# Patient Record
Sex: Female | Born: 1960 | Race: Black or African American | Hispanic: No | Marital: Married | Smoking: Never smoker
Health system: Southern US, Community
[De-identification: ages and names within clinical notes are randomized; demographics above are authoritative.]

## PROBLEM LIST (undated history)

## (undated) DIAGNOSIS — J45909 Unspecified asthma, uncomplicated: Secondary | ICD-10-CM

## (undated) DIAGNOSIS — R011 Cardiac murmur, unspecified: Secondary | ICD-10-CM

## (undated) DIAGNOSIS — E785 Hyperlipidemia, unspecified: Secondary | ICD-10-CM

## (undated) DIAGNOSIS — M1990 Unspecified osteoarthritis, unspecified site: Secondary | ICD-10-CM

## (undated) DIAGNOSIS — T7840XA Allergy, unspecified, initial encounter: Secondary | ICD-10-CM

## (undated) DIAGNOSIS — I10 Essential (primary) hypertension: Secondary | ICD-10-CM

## (undated) DIAGNOSIS — B019 Varicella without complication: Secondary | ICD-10-CM

## (undated) HISTORY — DX: Varicella without complication: B01.9

## (undated) HISTORY — DX: Allergy, unspecified, initial encounter: T78.40XA

## (undated) HISTORY — DX: Unspecified asthma, uncomplicated: J45.909

## (undated) HISTORY — DX: Essential (primary) hypertension: I10

## (undated) HISTORY — DX: Unspecified osteoarthritis, unspecified site: M19.90

## (undated) HISTORY — DX: Cardiac murmur, unspecified: R01.1

## (undated) HISTORY — DX: Hyperlipidemia, unspecified: E78.5

---

## 1993-01-29 HISTORY — PX: ABDOMINAL HYSTERECTOMY: SHX81

## 1997-12-16 ENCOUNTER — Other Ambulatory Visit: Admission: RE | Admit: 1997-12-16 | Discharge: 1997-12-16 | Payer: Self-pay | Admitting: Obstetrics & Gynecology

## 1999-01-11 ENCOUNTER — Other Ambulatory Visit: Admission: RE | Admit: 1999-01-11 | Discharge: 1999-01-11 | Payer: Self-pay | Admitting: Obstetrics & Gynecology

## 1999-09-20 ENCOUNTER — Encounter: Admission: RE | Admit: 1999-09-20 | Discharge: 1999-09-20 | Payer: Self-pay | Admitting: Obstetrics & Gynecology

## 1999-09-20 ENCOUNTER — Encounter: Payer: Self-pay | Admitting: Obstetrics & Gynecology

## 2000-03-29 ENCOUNTER — Other Ambulatory Visit: Admission: RE | Admit: 2000-03-29 | Discharge: 2000-03-29 | Payer: Self-pay | Admitting: Obstetrics & Gynecology

## 2001-09-04 ENCOUNTER — Encounter: Payer: Self-pay | Admitting: Obstetrics & Gynecology

## 2001-09-04 ENCOUNTER — Encounter: Admission: RE | Admit: 2001-09-04 | Discharge: 2001-09-04 | Payer: Self-pay | Admitting: Obstetrics & Gynecology

## 2002-04-27 ENCOUNTER — Other Ambulatory Visit: Admission: RE | Admit: 2002-04-27 | Discharge: 2002-04-27 | Payer: Self-pay | Admitting: Obstetrics & Gynecology

## 2002-05-21 ENCOUNTER — Ambulatory Visit (HOSPITAL_COMMUNITY): Admission: RE | Admit: 2002-05-21 | Discharge: 2002-05-21 | Payer: Self-pay | Admitting: Obstetrics & Gynecology

## 2002-05-21 ENCOUNTER — Encounter (INDEPENDENT_AMBULATORY_CARE_PROVIDER_SITE_OTHER): Payer: Self-pay | Admitting: Specialist

## 2004-09-15 ENCOUNTER — Encounter: Admission: RE | Admit: 2004-09-15 | Discharge: 2004-09-15 | Payer: Self-pay | Admitting: Obstetrics & Gynecology

## 2005-09-26 ENCOUNTER — Encounter: Admission: RE | Admit: 2005-09-26 | Discharge: 2005-09-26 | Payer: Self-pay | Admitting: Obstetrics & Gynecology

## 2006-05-13 ENCOUNTER — Encounter: Admission: RE | Admit: 2006-05-13 | Discharge: 2006-05-13 | Payer: Self-pay | Admitting: Obstetrics & Gynecology

## 2007-06-04 ENCOUNTER — Encounter: Admission: RE | Admit: 2007-06-04 | Discharge: 2007-06-04 | Payer: Self-pay | Admitting: Obstetrics & Gynecology

## 2007-06-12 ENCOUNTER — Encounter: Admission: RE | Admit: 2007-06-12 | Discharge: 2007-06-12 | Payer: Self-pay | Admitting: Obstetrics & Gynecology

## 2008-06-07 ENCOUNTER — Encounter: Admission: RE | Admit: 2008-06-07 | Discharge: 2008-06-07 | Payer: Self-pay | Admitting: Obstetrics & Gynecology

## 2009-07-13 ENCOUNTER — Encounter: Admission: RE | Admit: 2009-07-13 | Discharge: 2009-07-13 | Payer: Self-pay | Admitting: Obstetrics & Gynecology

## 2009-07-19 ENCOUNTER — Encounter: Admission: RE | Admit: 2009-07-19 | Discharge: 2009-07-19 | Payer: Self-pay | Admitting: Obstetrics & Gynecology

## 2010-06-16 NOTE — Op Note (Signed)
NAME:  Kirsten Campbell, Kirsten Campbell                         ACCOUNT NO.:  [id number]   MEDICAL RECORD NO.:  [id number]                   PATIENT TYPE:  AMB   LOCATION:  SDC                                  FACILITY:  [facility]   PHYSICIAN:  Freddy Finner, M.D.                DATE OF BIRTH:  [birth year]   DATE OF PROCEDURE:  05/21/2002  DATE OF DISCHARGE:                                 OPERATIVE REPORT   PREOPERATIVE DIAGNOSES:  1. Right ovarian cyst.  2. Chronic right pelvic pain   POSTOPERATIVE DIAGNOSES:  1. Extensive intraabdominal and pelvic adhesions.  2. Benign-appearing simple right ovarian cyst.   OPERATIVE PROCEDURE:  Laparoscopy, lysis of adhesions, aspiration of cyst  fluid, peritoneal washings.   ANESTHESIA:  General.   INTRAOPERATIVE COMPLICATIONS:  None.   SURGEON:  Freddy Finner, M.D.   ESTIMATED INTRAOPERATIVE BLOOD LOSS:  10 mL.   INDICATIONS FOR PROCEDURE:  The patient is a 50 year old with chronic right  pelvic pain who was found to have an ovarian cyst which was simple in  appearance that persisted for a period of four to six weeks.  She has a long  history of chronic right pelvic pain and dyspareunia.  She is admitted now  for laparoscopy.   DESCRIPTION OF PROCEDURE:  She was admitted on the morning of surgery,  brought to the operating room, there placed under adequate general  endotracheal anesthesia, placed in the dorsal lithotomy position using the  [facility] stirrup system.  Betadine prep of abdomen was carried out in the usual  fashion.  The bladder was evacuated using sterile technique and a Robinson  catheter.  Sterile drapes were applied.  Two incisions were initially made -  one to the umbilicus and one in the midline approximately 3 cm above the  symphysis.  Because of the panniculus and the obesity it was difficult  placing the 11 mm disposable nonbladed trocar at the umbilicus, but  eventually the peritoneum was entered.  There was no evidence of injury  on  entry.  Pneumoperitoneum was allowed to accumulate with carbon dioxide gas.  A second trocar was placed through the incision initially made but this was  high enough that it conflicted with the umbilical trocar and for that reason  a third incision was made immediately above the symphysis in the midline.  The 5 mm trocar was then adequately replaced through this incision under  direct visualization.  There were extensive adhesions in the pelvis.  The  left tube and ovary were noted to be surgically absent and the adhesions in  this location were not addressed.  There were near-midline adhesions with  omental and/or epiploica bowel stuck to the anterior abdominal wall.  Using  the Endoshears and unipolar cautery many of these adhesions were lysed.  The  rectosigmoid colon was distorted and was in traction to the right side.  These adhesions too  were lysed to partially release this traction on the  rectosigmoid.  A bleeding source was encountered but this was adequately  controlled using bipolar coagulation.  This was confirmed by inspection  under reduced pressure and inspection under irrigation.  The ovary had a  very small cyst.  Aspiration produced a very small amount of fluid.  This  was submitted separately.  Lactated Ringers solution was used for irrigation  and the initial irrigant was aspirated and submitted for peritoneal  washings.  The appendix was visualized and was normal.  The right ovary was  essentially normal.  Photos were made of all of these findings.  There was  no active evidence of pelvic endometriosis.  The remaining fallopian tube  was normal.  It was elected not to remove the ovary because of the patient's  age and continued hormonal function from the ovary.  Irrigation was carried  out; hemostasis was confirmed.  The procedure at this point was terminated.  The irrigating solution was aspirated from the abdomen.  All instruments  were removed.  The skin  incisions were closed with interrupted subcuticular  sutures of 3-0 Dexon.  The two lower incisions were also closed with Steri-  Strips.  The patient was awakened and taken to recovery in good condition.   DISPOSITION:  She will be discharged in the immediate postoperative period  for follow-up in the office in two weeks.  She was given routine outpatient  surgical instructions.  She was given Vicodin to be taken as needed for  pain.                                               Freddy Finner, M.D.    WRN/MEDQ  D:  05/21/2002  T:  05/21/2002  Job:  [id number]

## 2010-08-01 ENCOUNTER — Other Ambulatory Visit: Payer: Self-pay | Admitting: Obstetrics & Gynecology

## 2010-08-01 DIAGNOSIS — Z1231 Encounter for screening mammogram for malignant neoplasm of breast: Secondary | ICD-10-CM

## 2010-08-08 ENCOUNTER — Ambulatory Visit: Payer: Self-pay

## 2010-08-09 ENCOUNTER — Ambulatory Visit
Admission: RE | Admit: 2010-08-09 | Discharge: 2010-08-09 | Disposition: A | Discharge status: Home / Self Care | Payer: BC Managed Care – PPO | Source: Ambulatory Visit | Attending: Obstetrics & Gynecology | Admitting: Obstetrics & Gynecology

## 2010-08-09 DIAGNOSIS — Z1231 Encounter for screening mammogram for malignant neoplasm of breast: Secondary | ICD-10-CM

## 2011-07-12 ENCOUNTER — Other Ambulatory Visit: Payer: Self-pay | Admitting: Obstetrics & Gynecology

## 2011-07-12 DIAGNOSIS — Z1231 Encounter for screening mammogram for malignant neoplasm of breast: Secondary | ICD-10-CM

## 2011-08-13 ENCOUNTER — Ambulatory Visit
Admission: RE | Admit: 2011-08-13 | Discharge: 2011-08-13 | Disposition: A | Discharge status: Home / Self Care | Payer: BC Managed Care – PPO | Source: Ambulatory Visit | Attending: Obstetrics & Gynecology | Admitting: Obstetrics & Gynecology

## 2011-08-13 DIAGNOSIS — Z1231 Encounter for screening mammogram for malignant neoplasm of breast: Secondary | ICD-10-CM

## 2012-07-16 ENCOUNTER — Other Ambulatory Visit: Payer: Self-pay

## 2012-07-16 DIAGNOSIS — Z1231 Encounter for screening mammogram for malignant neoplasm of breast: Secondary | ICD-10-CM

## 2012-08-13 ENCOUNTER — Ambulatory Visit: Payer: BC Managed Care – PPO

## 2012-09-01 ENCOUNTER — Ambulatory Visit
Admission: RE | Admit: 2012-09-01 | Discharge: 2012-09-01 | Disposition: A | Discharge status: Home / Self Care | Payer: BC Managed Care – PPO | Source: Ambulatory Visit

## 2012-09-01 DIAGNOSIS — Z1231 Encounter for screening mammogram for malignant neoplasm of breast: Secondary | ICD-10-CM

## 2012-09-02 ENCOUNTER — Other Ambulatory Visit: Payer: Self-pay | Admitting: Obstetrics & Gynecology

## 2012-09-02 DIAGNOSIS — R928 Other abnormal and inconclusive findings on diagnostic imaging of breast: Secondary | ICD-10-CM

## 2012-09-18 ENCOUNTER — Ambulatory Visit
Admission: RE | Admit: 2012-09-18 | Discharge: 2012-09-18 | Disposition: A | Discharge status: Home / Self Care | Payer: BC Managed Care – PPO | Source: Ambulatory Visit | Attending: Obstetrics & Gynecology | Admitting: Obstetrics & Gynecology

## 2012-09-18 DIAGNOSIS — R928 Other abnormal and inconclusive findings on diagnostic imaging of breast: Secondary | ICD-10-CM

## 2012-09-25 ENCOUNTER — Other Ambulatory Visit: Payer: Self-pay | Admitting: Obstetrics & Gynecology

## 2012-09-25 DIAGNOSIS — R922 Inconclusive mammogram: Secondary | ICD-10-CM

## 2013-05-07 ENCOUNTER — Other Ambulatory Visit (INDEPENDENT_AMBULATORY_CARE_PROVIDER_SITE_OTHER): Payer: BC Managed Care – PPO

## 2013-05-07 ENCOUNTER — Encounter: Payer: Self-pay | Admitting: Family Medicine

## 2013-05-07 ENCOUNTER — Ambulatory Visit (INDEPENDENT_AMBULATORY_CARE_PROVIDER_SITE_OTHER): Payer: BC Managed Care – PPO | Admitting: Family Medicine

## 2013-05-07 VITALS — BP 122/84 | HR 71 | Wt 218.0 lb

## 2013-05-07 DIAGNOSIS — M1710 Unilateral primary osteoarthritis, unspecified knee: Secondary | ICD-10-CM | POA: Insufficient documentation

## 2013-05-07 DIAGNOSIS — M25569 Pain in unspecified knee: Secondary | ICD-10-CM

## 2013-05-07 DIAGNOSIS — M25561 Pain in right knee: Secondary | ICD-10-CM

## 2013-05-07 DIAGNOSIS — M79609 Pain in unspecified limb: Secondary | ICD-10-CM

## 2013-05-07 DIAGNOSIS — M79671 Pain in right foot: Secondary | ICD-10-CM

## 2013-05-07 DIAGNOSIS — M7742 Metatarsalgia, left foot: Secondary | ICD-10-CM

## 2013-05-07 DIAGNOSIS — M7741 Metatarsalgia, right foot: Secondary | ICD-10-CM

## 2013-05-07 DIAGNOSIS — M7750 Other enthesopathy of unspecified foot: Secondary | ICD-10-CM

## 2013-05-07 NOTE — Assessment & Plan Note (Signed)
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 I do think patient may do better with losing weight. Patient was given a brace today that was fitted by me. We discussed icing protocol and over-the-counter medications he can be beneficial. Patient will try these interventions and come back again in 3-4 weeks. I do think patient's foot could also be contributing and we'll see if we make adjustments there if this will improve.

## 2013-05-07 NOTE — Patient Instructions (Signed)
Always good to see you Wear brace daily for next 2 weeks Ice 20 minutes 2 times daily exercises 3 times a week.  Take tylenol (acetaminophen) 650 mg three times a day is the best evidence based medicine we have for arthritis.  Glucosamine sulfate 750mg  twice a day is a supplement that has been shown to help moderate to severe arthritis. Vitamin D 2000 IU daily Fish oil 2 grams daily.  Tumeric 500mg  twice daily.  Capsaicin topically up to four times a day may also help with pain. Cortisone injections are an option if these interventions do not seem to make a difference or need more relief.  If cortisone injections do not help, there are different types of shots that may help but they take longer to take effect.  We can discuss this at follow up.  It's important that you continue to stay active. Controlling your weight is important.  Consider physical therapy to strengthen muscles around the joint that hurts to take pressure off of the joint itself. Shoe inserts with good arch support may be helpful.  [employer] could help.  Water aerobics and cycling with low resistance are the best two types of exercise for arthritis. Come back and see me in 3 weeks.

## 2013-05-07 NOTE — Progress Notes (Signed)
[name] [address] [place], [place] [address] Phone: [phone] Subjective:     CC:  Pain in knee and foot.   QMV:[id number] Kirsten Campbell is a 53 y.o. female coming in with complaint of right knee and the pain  Regarding patient's right knee pain she has had this for years. Patient has seen another provider and has had steroid injections as well as viscous supplementation multiple years ago. Patient has been told that she does have severe arthritis in this knee and they did consider a knee replacement. Patient like to avoid this if possible. Patient describes the pain as more of a dull aching sensation that is worse with a lot of twisting motions. Patient states that she can walk usually without any significant trouble going up or down stairs can be a triple. Denies any radiation down the leg or any numbness in the foot and does not notice any significant weakness. Patient states that the pain can be so bad but the severity of 7 at 10 that can contribute.  Patient is also complaining of right foot pain. Patient states hurts on the top of her foot mostly in the arch. Patient states throughout the week she does fairly well but then she wears heels on Sunday and then has pain for 4 days afterwards. Patient states that she needs to continue to wear her heels for church she notes that this. Patient notices improvement in pain when and when not wearing certain shoes. Patient denies any numbness. Has never had any true injury to the foot. States that from time to time she cannot swelling. Pain is 6/10.     Past medical history, social, surgical and family history all reviewed in electronic medical record.   Review of Systems: No headache, visual changes, nausea, vomiting, diarrhea, constipation, dizziness, abdominal pain, skin rash, fevers, chills, night sweats, weight loss, swollen lymph nodes, body aches, joint swelling, muscle aches, chest pain, shortness of  breath, mood changes.   Objective Blood pressure 122/84, pulse 71, weight 218 lb (98.884 kg), SpO2 99.00%.  General: No apparent distress alert and oriented x3 mood and affect normal, dressed appropriately.  HEENT: Pupils equal, extraocular movements intact  Respiratory: Patient's speak in full sentences and does not appear short of breath  Cardiovascular: No lower extremity edema, non tender, no erythema  Skin: Warm dry intact with no signs of infection or rash on extremities or on axial skeleton.  Abdomen: Soft nontender  Neuro: Cranial nerves II through XII are intact, neurovascularly intact in all extremities with 2+ DTRs and 2+ pulses.  Lymph: No lymphadenopathy of posterior or anterior cervical chain or axillae bilaterally.  Gait normal with good balance and coordination.  MSK:  Non tender with full range of motion and good stability and symmetric strength and tone of shoulders, elbows, wrist, hip, and ankles bilaterally.  Knee: Right Patient does have mild osteoarthritic changes of the knee on inspection Patient is minimally tender to palpation over the medial joint line otherwise unremarkable ROM full in flexion and extension and lower leg rotation. Ligaments with solid consistent endpoints including ACL, PCL, LCL, MCL. Positive Mcmurray's, Apley's, and Thessalonian tests. Non painful patellar compression. Patellar glide without crepitus. Patellar and quadriceps tendons unremarkable. Hamstring and quadriceps strength is normal.  Foot exam shows the patient does have significant bunion and bunionette formations of the feet bilaterally with patient having fibular deviation of the large toe significantly bilaterally. Patient does have a breakdown of the  transverse as well as longitudinal arches with mild varus deformity of the hindfoot.  MSK US performed of: Right knee This study was ordered, performed, and interpreted by Terrilee FilesZach Shalene Gallen D.O.  Knee: All structures  visualized. Anteromedial, anterolateral, posteromedial, and posterolateral menisci unremarkable without tearing, fraying, effusion, or displacement. And does have degenerative changes but no specific tear appreciated. Patient does have narrowing of the joint space bilaterally Patellar Tendon unremarkable on long and transverse views without effusion. No abnormality of prepatellar bursa. LCL and MCL unremarkable on long and transverse views. No abnormality of origin of medial or lateral head of the gastrocnemius.  IMPRESSION:  Degenerative changes of the meniscus and underlying osteoarthritic changes.    Impression and Recommendations:     This case required medical decision making of moderate complexity.

## 2013-05-07 NOTE — Assessment & Plan Note (Signed)
Patient does have significant breakdown of her feet bilaterally but likely has midfoot osteoarthritic changes. We discussed over-the-counter medications he can be beneficial as well as an icing regimen. Patient given home exercise program to try to help with keeping the amount of arch she has left. We discussed over-the-counter orthotics as well as shoe choices. Patient had these interventions and come back again in 3-4 weeks. She continues to have pain we will do custom orthotics.

## 2013-05-28 ENCOUNTER — Ambulatory Visit (INDEPENDENT_AMBULATORY_CARE_PROVIDER_SITE_OTHER): Payer: BC Managed Care – PPO | Admitting: Family Medicine

## 2013-05-28 ENCOUNTER — Encounter: Payer: Self-pay | Admitting: Family Medicine

## 2013-05-28 VITALS — BP 138/86 | HR 78

## 2013-05-28 DIAGNOSIS — M7741 Metatarsalgia, right foot: Secondary | ICD-10-CM

## 2013-05-28 DIAGNOSIS — M1710 Unilateral primary osteoarthritis, unspecified knee: Secondary | ICD-10-CM

## 2013-05-28 DIAGNOSIS — M7742 Metatarsalgia, left foot: Secondary | ICD-10-CM

## 2013-05-28 DIAGNOSIS — M7750 Other enthesopathy of unspecified foot: Secondary | ICD-10-CM

## 2013-05-28 NOTE — Patient Instructions (Signed)
Good to see you as always.  Continue the exercises.  Continue the meds for now Try not to wear the knee brace for short trips out of the house.  Continue the exercises at least 4 times a week.  I will drop off orthotics on Monday wear first day for 4 hours then increase 1 hour a day.  See me again in 4 weeks.

## 2013-05-28 NOTE — Assessment & Plan Note (Signed)
Patient was made custom orthotics today. Patient will slowly progressing wearing them over the course of time. I think this will help a lot of her significant for pain. Patient will continue the exercises on the over-the-counter medications and ice when necessary. Patient continues have pain with no is try admitted foot intra-articular injection. Patient come back again in 3-4 weeks for further evaluation and treatment.

## 2013-05-28 NOTE — Progress Notes (Signed)
[facility] Jonathyn Carothers D.O. [facility] [address] [place], [place] [address] Phone: [phone]) [phone] Subjective:     CC:  Pain in knee and foot follow  up  BJY:NWGNFAOZHYHPI:Subjective Kirsten Campbell is a 53 y.o. female coming in with complaint of right knee and the pain  Patient is returning for followup of her right knee pain. Patient has degenerative changes of the meniscus as well as osteoarthritic findings. Patient has been doing home exercise program, icing protocol we discussed over-the-counter medications. Patient states she is doing significantly better with the knee. Patient states that the brace does help. Patient states he medications do seem to help as well. Overall she continues to do icing as well and is not giving her any instability but she was having before. It is fairly happy with the results of her knee.  Patient also has significant amount of arch pain in the bilateral feet. Patient did get new shoes as well as started taking over-the-counter medications which has helped. Patient is still has significant amount of arch pain mostly on the right foot especially at the end of a long day. Patient has not been wearing her heels as much which has helped a little bit. Patient though finds it difficult to get through a day and has been doing all conservative therapy without significant improvement though.     Past medical history, social, surgical and family history all reviewed in electronic medical record.   Review of Systems: No headache, visual changes, nausea, vomiting, diarrhea, constipation, dizziness, abdominal pain, skin rash, fevers, chills, night sweats, weight loss, swollen lymph nodes, body aches, joint swelling, muscle aches, chest pain, shortness of breath, mood changes.   Objective Blood pressure 138/86, pulse 78, SpO2 96.00%.  General: No apparent distress alert and oriented x3 mood and affect normal, dressed appropriately.  HEENT: Pupils equal, extraocular movements intact    Respiratory: Patient's speak in full sentences and does not appear short of breath  Cardiovascular: No lower extremity edema, non tender, no erythema  Skin: Warm dry intact with no signs of infection or rash on extremities or on axial skeleton.  Abdomen: Soft nontender  Neuro: Cranial nerves II through XII are intact, neurovascularly intact in all extremities with 2+ DTRs and 2+ pulses.  Lymph: No lymphadenopathy of posterior or anterior cervical chain or axillae bilaterally.  Gait normal with good balance and coordination.  MSK:  Non tender with full range of motion and good stability and symmetric strength and tone of shoulders, elbows, wrist, hip, and ankles bilaterally.  Knee: Right Patient does have mild osteoarthritic changes of the knee on inspection Patient is minimally tender to palpation over the medial joint line otherwise unremarkable mild improvement from previous exam. ROM full in flexion and extension and lower leg rotation. Ligaments with solid consistent endpoints including ACL, PCL, LCL, MCL. Negative Mcmurray's, Apley's, and Thessalonian tests. Non painful patellar compression. Patellar glide without crepitus. Patellar and quadriceps tendons unremarkable. Hamstring and quadriceps strength is normal.  Foot exam shows the patient does have significant bunion and bunionette formations of the feet bilaterally with patient having fibular deviation of the large toe significantly bilaterally. Patient does have a breakdown of the transverse as well as longitudinal arches with mild varus deformity of the hindfoot.   Patient was fitted for a : standard, cushioned, semi-rigid orthotic. The orthotic was heated and afterward the patient stood on the orthotic blank positioned on the orthotic stand. The patient was positioned in subtalar neutral position and 10  degrees of ankle dorsiflexion in a weight bearing stance. After completion of molding, a stable base was applied to the  orthotic blank. The blank was ground to a stable position for weight bearing. Size: 10 Base: [facility] Additional Posting and Padding: None The patient ambulated these, and they were very comfortable.  I spent 45 minutes with this patient, greater than 50% was face-to-face time counseling regarding the below diagnosis.    Impression and Recommendations:

## 2013-05-28 NOTE — Assessment & Plan Note (Signed)
Patient is doing better with knee brace and home activity. Patient encouraged to continue this at least 3-4 times a week as well as the icing protocol. Patient will continue over-the-counter medications. If patient has any exacerbation we'll consider corticosteroid injection. The patient is doing well and I do not think this is necessary at this time the

## 2013-08-14 ENCOUNTER — Other Ambulatory Visit: Payer: Self-pay

## 2013-08-14 DIAGNOSIS — Z1231 Encounter for screening mammogram for malignant neoplasm of breast: Secondary | ICD-10-CM

## 2013-08-14 DIAGNOSIS — Z803 Family history of malignant neoplasm of breast: Secondary | ICD-10-CM

## 2013-09-07 ENCOUNTER — Encounter (INDEPENDENT_AMBULATORY_CARE_PROVIDER_SITE_OTHER): Payer: Self-pay

## 2013-09-07 ENCOUNTER — Ambulatory Visit
Admission: RE | Admit: 2013-09-07 | Discharge: 2013-09-07 | Disposition: A | Discharge status: Home / Self Care | Payer: BC Managed Care – PPO | Source: Ambulatory Visit

## 2013-09-07 DIAGNOSIS — Z803 Family history of malignant neoplasm of breast: Secondary | ICD-10-CM

## 2013-09-07 DIAGNOSIS — Z1231 Encounter for screening mammogram for malignant neoplasm of breast: Secondary | ICD-10-CM

## 2014-01-08 ENCOUNTER — Telehealth: Payer: Self-pay | Admitting: Family Medicine

## 2014-01-08 NOTE — Telephone Encounter (Signed)
Spoke to pt, advised her to give it through the weekend & if she's still hurting on Monday to give us a call & schedule an appt. Pt understood.

## 2014-01-08 NOTE — Telephone Encounter (Signed)
Patient states since she spoke last night the bone in her right knee was in pain. Patient did ice last night.  This did seem to help some. She is requesting a call back in regards.

## 2014-04-27 ENCOUNTER — Ambulatory Visit (INDEPENDENT_AMBULATORY_CARE_PROVIDER_SITE_OTHER): Payer: BC Managed Care – PPO | Admitting: Internal Medicine

## 2014-04-27 ENCOUNTER — Encounter: Payer: Self-pay | Admitting: Internal Medicine

## 2014-04-27 VITALS — BP 138/92 | HR 65 | Temp 98.0°F | Resp 12 | Ht 66.0 in | Wt 223.0 lb

## 2014-04-27 DIAGNOSIS — E785 Hyperlipidemia, unspecified: Secondary | ICD-10-CM | POA: Diagnosis not present

## 2014-04-27 DIAGNOSIS — I10 Essential (primary) hypertension: Secondary | ICD-10-CM

## 2014-04-27 DIAGNOSIS — Z0000 Encounter for general adult medical examination without abnormal findings: Secondary | ICD-10-CM

## 2014-04-27 DIAGNOSIS — E669 Obesity, unspecified: Secondary | ICD-10-CM | POA: Diagnosis not present

## 2014-04-27 NOTE — Patient Instructions (Signed)
We have ordered your blood work and you can come back some morning before you eat to get it done. The lab opens up at 7:30 am.   You can come back in June or July for the physical. If you have any problems or questions please call the office sooner.   Exercise to Stay Healthy Exercise helps you become and stay healthy. EXERCISE IDEAS AND TIPS Choose exercises that:  You enjoy.  Fit into your day. You do not need to exercise really hard to be healthy. You can do exercises at a slow or medium level and stay healthy. You can:  Stretch before and after working out.  Try yoga, Pilates, or tai chi.  Lift weights.  Walk fast, swim, jog, run, climb stairs, bicycle, dance, or rollerskate.  Take aerobic classes. Exercises that burn about 150 calories:  Running 1  miles in 15 minutes.  Playing volleyball for 45 to 60 minutes.  Washing and waxing a car for 45 to 60 minutes.  Playing touch football for 45 minutes.  Walking 1  miles in 35 minutes.  Pushing a stroller 1  miles in 30 minutes.  Playing basketball for 30 minutes.  Raking leaves for 30 minutes.  Bicycling 5 miles in 30 minutes.  Walking 2 miles in 30 minutes.  Dancing for 30 minutes.  Shoveling snow for 15 minutes.  Swimming laps for 20 minutes.  Walking up stairs for 15 minutes.  Bicycling 4 miles in 15 minutes.  Gardening for 30 to 45 minutes.  Jumping rope for 15 minutes.  Washing windows or floors for 45 to 60 minutes. Document Released: 02/17/2010 Document Revised: 04/09/2011 Document Reviewed: 02/17/2010 [facility] Patient Information 2015 [place], [place]. This information is not intended to replace advice given to you by your health care provider. Make sure you discuss any questions you have with your health care provider.

## 2014-04-27 NOTE — Progress Notes (Signed)
Pre visit review using our clinic review tool, if applicable. No additional management support is needed unless otherwise documented below in the visit note. 

## 2014-04-28 ENCOUNTER — Other Ambulatory Visit (INDEPENDENT_AMBULATORY_CARE_PROVIDER_SITE_OTHER): Payer: BC Managed Care – PPO

## 2014-04-28 DIAGNOSIS — Z0000 Encounter for general adult medical examination without abnormal findings: Secondary | ICD-10-CM

## 2014-04-28 LAB — COMPREHENSIVE METABOLIC PANEL
ALT: 19 U/L (ref 0–35)
AST: 19 U/L (ref 0–37)
Albumin: 3.6 g/dL (ref 3.5–5.2)
Alkaline Phosphatase: 34 U/L — ABNORMAL LOW (ref 39–117)
BUN: 16 mg/dL (ref 6–23)
CO2: 29 mEq/L (ref 19–32)
Calcium: 9.6 mg/dL (ref 8.4–10.5)
Chloride: 101 mEq/L (ref 96–112)
Creatinine, Ser: 0.9 mg/dL (ref 0.40–1.20)
GFR: 83.27 mL/min (ref >60.00)
Glucose, Bld: 93 mg/dL (ref 70–99)
Potassium: 3.5 mEq/L (ref 3.5–5.1)
Sodium: 136 mEq/L (ref 135–145)
Total Bilirubin: 0.2 mg/dL (ref 0.2–1.2)
Total Protein: 7.1 g/dL (ref 6.0–8.3)

## 2014-04-28 LAB — CBC
HCT: 37.4 % (ref 36.0–46.0)
Hemoglobin: 12.3 g/dL (ref 12.0–15.0)
MCHC: 32.9 g/dL (ref 30.0–36.0)
MCV: 84.4 fl (ref 78.0–100.0)
Platelets: 388 10*3/uL (ref 150.0–400.0)
RBC: 4.43 Mil/uL (ref 3.87–5.11)
RDW: 13.6 % (ref 11.5–15.5)
WBC: 7.1 10*3/uL (ref 4.0–10.5)

## 2014-04-28 LAB — LIPID PANEL
Cholesterol: 218 mg/dL — ABNORMAL HIGH (ref 0–200)
HDL: 67.3 mg/dL (ref >39.00)
LDL Cholesterol: 123 mg/dL — ABNORMAL HIGH (ref 0–99)
NonHDL: 150.7
Total CHOL/HDL Ratio: 3
Triglycerides: 139 mg/dL (ref 0.0–149.0)
VLDL: 27.8 mg/dL (ref 0.0–40.0)

## 2014-04-28 LAB — HEMOGLOBIN A1C: Hgb A1c MFr Bld: 6.4 % (ref 4.6–6.5)

## 2014-04-30 ENCOUNTER — Encounter: Payer: Self-pay | Admitting: Internal Medicine

## 2014-04-30 DIAGNOSIS — E669 Obesity, unspecified: Secondary | ICD-10-CM | POA: Insufficient documentation

## 2014-04-30 DIAGNOSIS — I10 Essential (primary) hypertension: Secondary | ICD-10-CM | POA: Insufficient documentation

## 2014-04-30 DIAGNOSIS — E785 Hyperlipidemia, unspecified: Secondary | ICD-10-CM | POA: Insufficient documentation

## 2014-04-30 NOTE — Assessment & Plan Note (Signed)
Checking lipids, HgA1c. Talked with her about exercise and serving sizes as a way to decrease her portions. She is not sure she will be successful but did stress the importance of losing weight for her joints now and in the future. She would like to avoid joint replacement so this did seem to motivate her a little more.

## 2014-04-30 NOTE — Assessment & Plan Note (Signed)
BP mildly elevated today and not sure why she is on two different calcium channel blockers. Will continue regimen for now and get records to see if there are any other conditions that we are treating. Continue verapamil, azilsartan/chlorthalidone, amlodipine. Checking labs today.

## 2014-04-30 NOTE — Progress Notes (Signed)
   Subjective:    Patient ID: Kirsten GlazierNaomi R Hallam, female    DOB: [birth year], 54 y.o.   MRN: [id number]  HPI The patient is a 54 YO female who is coming in new to check on her blood pressure. She has been on amlodipine, azilsartan/chlorthalidone, verapamil and denies any known complications. She has not been checking her blood pressure at home so does not know if it has been high. She does not miss her medications generally. She denies any new problems over her known medical conditions.   [facility], [facility], social history reviewed and updated today.   Review of Systems  Constitutional: Negative for fever, activity change, appetite change and unexpected weight change.  HENT: Negative.   Eyes: Negative.   Respiratory: Negative for cough, chest tightness, shortness of breath and wheezing.   Cardiovascular: Negative for chest pain, palpitations and leg swelling.  Gastrointestinal: Negative for nausea, abdominal pain, diarrhea and abdominal distention.  Musculoskeletal: Positive for arthralgias. Negative for myalgias and back pain.  Skin: Negative.   Neurological: Negative.   Psychiatric/Behavioral: Negative.       Objective:   Physical Exam  Constitutional: She is oriented to person, place, and time. She appears well-developed and well-nourished.  HENT:  Head: Normocephalic and atraumatic.  Eyes: EOM are normal.  Neck: Normal range of motion.  Cardiovascular: Normal rate and regular rhythm.   Pulmonary/Chest: Effort normal and breath sounds normal.  Abdominal: Soft.  Musculoskeletal: She exhibits no edema.  Neurological: She is alert and oriented to person, place, and time. Coordination normal.  Skin: Skin is warm and dry.   Filed Vitals:   04/27/14 0859  BP: 138/92  Pulse: 65  Temp: 98 F (36.7 C)  TempSrc: Oral  Resp: 12  Height: 5\' 6"  (1.676 m)  Weight: 223 lb (101.152 kg)  SpO2: 98%      Assessment & Plan:

## 2014-05-04 ENCOUNTER — Telehealth: Payer: Self-pay | Admitting: Internal Medicine

## 2014-05-04 NOTE — Telephone Encounter (Signed)
Spoke with patient and discussed lab results.  

## 2014-05-04 NOTE — Telephone Encounter (Signed)
Patient returned your call.

## 2014-05-06 ENCOUNTER — Other Ambulatory Visit: Payer: Self-pay | Admitting: Obstetrics & Gynecology

## 2014-05-07 LAB — CYTOLOGY - PAP

## 2014-06-14 ENCOUNTER — Encounter: Payer: Self-pay | Admitting: Family Medicine

## 2014-06-14 ENCOUNTER — Ambulatory Visit (INDEPENDENT_AMBULATORY_CARE_PROVIDER_SITE_OTHER): Payer: BC Managed Care – PPO | Admitting: Family Medicine

## 2014-06-14 ENCOUNTER — Ambulatory Visit (INDEPENDENT_AMBULATORY_CARE_PROVIDER_SITE_OTHER)
Admission: RE | Admit: 2014-06-14 | Discharge: 2014-06-14 | Disposition: A | Discharge status: Home / Self Care | Payer: BC Managed Care – PPO | Source: Ambulatory Visit | Attending: Family Medicine | Admitting: Family Medicine

## 2014-06-14 ENCOUNTER — Telehealth: Payer: Self-pay | Admitting: Internal Medicine

## 2014-06-14 ENCOUNTER — Ambulatory Visit: Payer: BC Managed Care – PPO | Admitting: Internal Medicine

## 2014-06-14 ENCOUNTER — Other Ambulatory Visit (INDEPENDENT_AMBULATORY_CARE_PROVIDER_SITE_OTHER): Payer: BC Managed Care – PPO

## 2014-06-14 VITALS — BP 112/78 | HR 85 | Ht 66.0 in | Wt 200.0 lb

## 2014-06-14 DIAGNOSIS — M7062 Trochanteric bursitis, left hip: Secondary | ICD-10-CM

## 2014-06-14 DIAGNOSIS — M545 Low back pain, unspecified: Secondary | ICD-10-CM

## 2014-06-14 DIAGNOSIS — M25552 Pain in left hip: Secondary | ICD-10-CM | POA: Diagnosis not present

## 2014-06-14 MED ORDER — GABAPENTIN 100 MG PO CAPS: 100.0000 mg | ORAL_CAPSULE | Freq: Every day | ORAL | Status: DC

## 2014-06-14 NOTE — Progress Notes (Signed)
Tawana ScaleZach Shanee Batch D.O. [facility] [address] [place], [place] [address] Phone: [phone] Subjective:     CC:  left hip and leg pain   BJY:[id number]:Subjective Kirsten Glazieraomi R Shankel is a 54 y.o. female coming in with complaint of  left hip and leg pain. Patient states it started after the weekend. Patient has been doing Zumba classes. Patient states that she was doing much better and then had more pain. Seems, lateral aspect of the hip. Worse with sitting for long amount of time in trying to walk. Patient states since then it seems to be radiating past her knee on the lateral aspect to her calf. Denies any swelling. Denies any significant back pain associated with it. Patient has not tried any medications. States though that it is so painful that she has not been able to sleep for the last 2 days. Rates the severity of 8 out of 10. Denies any weakness.    Past medical history, social, surgical and family history all reviewed in electronic medical record.   Review of Systems: No headache, visual changes, nausea, vomiting, diarrhea, constipation, dizziness, abdominal pain, skin rash, fevers, chills, night sweats, weight loss, swollen lymph nodes, body aches, joint swelling, muscle aches, chest pain, shortness of breath, mood changes.   Objective Blood pressure 112/78, pulse 85, height 5\' 6"  (1.676 m), weight 200 lb (90.719 kg), SpO2 92 %.  General: No apparent distress alert and oriented x3 mood and affect normal, dressed appropriately.  HEENT: Pupils equal, extraocular movements intact  Respiratory: Patient's speak in full sentences and does not appear short of breath  Cardiovascular: No lower extremity edema, non tender, no erythema  Skin: Warm dry intact with no signs of infection or rash on extremities or on axial skeleton.  Abdomen: Soft nontender  Neuro: Cranial nerves II through XII are intact, neurovascularly intact in all extremities with 2+ DTRs and 2+ pulses.  Lymph: No lymphadenopathy  of posterior or anterior cervical chain or axillae bilaterally.  Gait normal with good balance and coordination.  MSK:  Non tender with full range of motion and good stability and symmetric strength and tone of shoulders, elbows, wrist, hip, knee and ankles bilaterally.  Back Exam:  Inspection: Unremarkable  Motion: Flexion 45 deg, Extension 45 deg, Side Bending to 45 deg bilaterally,  Rotation to 45 deg bilaterally  SLR laying: Negative  XSLR laying: Negative  Palpable tenderness: Pain over left greater trochanteric area FABER: Positive left Sensory change: Gross sensation intact to all lumbar and sacral dermatomes.  Reflexes: 2+ at both patellar tendons, 2+ at achilles tendons, Babinski's downgoing.  Strength at foot  Plantar-flexion: 5/5 Dorsi-flexion: 5/5 Eversion: 5/5 Inversion: 5/5  Leg strength  Quad: 5/5 Hamstring: 5/5 Hip flexor: 5/5 Hip abductors: 5/5  Gait unremarkable. MSK US performed of: Left This study was ordered, performed, and interpreted by Kirsten Campbell D.O.  Hip: Trochanteric bursa with significant hypoechoic changes and swelling Acetabular labrum visualized and without tears, displacement, or effusion in joint. Femoral neck appears unremarkable without increased power doppler signal along Cortex.  IMPRESSION:  Greater trochanter bursitis   Procedure: Real-time Ultrasound Guided Injection of left greater trochanteric bursitis secondary to patient's body habitus Device: GE Logiq E  Ultrasound guided injection is preferred based studies that show increased duration, increased effect, greater accuracy, decreased procedural pain, increased response rate, and decreased cost with ultrasound guided versus blind injection.  Verbal informed consent obtained.  Time-out conducted.  Noted no overlying erythema, induration, or other signs of  local infection.  Skin prepped in a sterile fashion.  Local anesthesia: Topical Ethyl chloride.  With sterile technique and under real  time ultrasound guidance:  Greater trochanteric area was visualized and patient's bursa was noted. A 22-gauge 3 inch needle was inserted and 4 cc of 0.5% Marcaine and 1 cc of Kenalog 40 mg/dL was injected. Pictures taken Completed without difficulty  Pain immediately resolved suggesting accurate placement of the medication.  Advised to call if fevers/chills, erythema, induration, drainage, or persistent bleeding.  Images permanently stored and available for review in the ultrasound unit.  Impression: Technically successful ultrasound guided injection.     Impression and Recommendations:     This case required medical decision making of moderate complexity.

## 2014-06-14 NOTE — Progress Notes (Signed)
Pre visit review using our clinic review tool, if applicable. No additional management support is needed unless otherwise documented below in the visit note. 

## 2014-06-14 NOTE — Assessment & Plan Note (Signed)
Patient was given an injection today with near complete resolution of pain. Discussed icing regimen, home exercises, topical and oral anti-inflammatories. She is going to continue to wear good shoes. Differential includes lumbar radiculopathy and x-ray is ordered today. This patient needs to have pain she will come back in 3 weeks and we'll discuss further treatment options and possible need for advance imaging.  Spent  25 minutes with patient face-to-face and had greater than 50% of counseling including as described above in assessment and plan.

## 2014-06-14 NOTE — Telephone Encounter (Signed)
Disregard

## 2014-06-14 NOTE — Patient Instructions (Signed)
Good to see you Ice 20 minutes 2 times daily. Usually after activity and before bed. Exercises 3 times a week.  Xray downstairs today.  Duexis 3 times a day for 6 days starting tomorrow.  Exercises 3 times a week.  pennsaid pinkie amount topically 2 times daily as needed.  Gabapentin 100mg  at night Zumba on Saturday See me in 2 weeks if not perfect

## 2014-06-22 ENCOUNTER — Telehealth: Payer: Self-pay | Admitting: Internal Medicine

## 2014-06-22 DIAGNOSIS — M25552 Pain in left hip: Secondary | ICD-10-CM

## 2014-06-22 NOTE — Telephone Encounter (Signed)
Pt Called in and said that she is still having the same issue with her leg, what else can she do?  Nothing has improved   Best number [phone]

## 2014-06-22 NOTE — Telephone Encounter (Signed)
lmovm for pt to return call.  

## 2014-06-22 NOTE — Telephone Encounter (Signed)
We can do 5 days of prednisone and see if it helps.  Maybe gabapentin 100mg  at night 'Then would need to be seens to work up for [employer][possible back.  If she wants the medicine please send in.

## 2014-06-23 MED ORDER — PREDNISONE 50 MG PO TABS: ORAL_TABLET | ORAL | Status: DC

## 2014-06-23 NOTE — Telephone Encounter (Signed)
Spoke to pt, she is already taking gabapentin 100mg  nightly. Sent in rx for prednisone 50mg .

## 2014-06-29 ENCOUNTER — Ambulatory Visit: Payer: BC Managed Care – PPO | Admitting: Family Medicine

## 2014-06-29 NOTE — Telephone Encounter (Signed)
Pt called in and said that her leg is getting worse.  She would like for you to call her if you can.

## 2014-06-29 NOTE — Addendum Note (Signed)
Addended by: Edwena FeltyARSON, LINDSAY T on: 06/29/2014 09:31 AM   Modules accepted: Orders

## 2014-06-29 NOTE — Telephone Encounter (Signed)
Spoke to pt- ordered xrays.

## 2014-07-05 ENCOUNTER — Telehealth: Payer: Self-pay | Admitting: Internal Medicine

## 2014-07-05 DIAGNOSIS — M5416 Radiculopathy, lumbar region: Secondary | ICD-10-CM

## 2014-07-05 NOTE — Telephone Encounter (Signed)
Patient has called back just wanting to let you know she is in a lot of pain

## 2014-07-05 NOTE — Telephone Encounter (Signed)
Pt called in about the status of her MRI that Dr Katrinka Blazingsmith was sending her to do?  Do you know the status on that?     Best number [phone]

## 2014-07-05 NOTE — Telephone Encounter (Signed)
lmovm for pt to return call.  

## 2014-07-05 NOTE — Telephone Encounter (Signed)
Discussed with pt. She is having an MRI on Thursday.

## 2014-07-05 NOTE — Telephone Encounter (Signed)
Tell her to increase gabapentin at night to 200mg   Take ibuprofen 600mg  3 times a day for 3 days.  COnitnue with the ice.  Tylenol 650mg  3 times daily Will try to get the MRI done soon.

## 2014-07-08 ENCOUNTER — Ambulatory Visit
Admission: RE | Admit: 2014-07-08 | Discharge: 2014-07-08 | Disposition: A | Discharge status: Home / Self Care | Payer: BC Managed Care – PPO | Source: Ambulatory Visit | Attending: Family Medicine | Admitting: Family Medicine

## 2014-07-08 DIAGNOSIS — M5416 Radiculopathy, lumbar region: Secondary | ICD-10-CM

## 2014-07-09 ENCOUNTER — Encounter: Payer: Self-pay | Admitting: Family Medicine

## 2014-07-09 ENCOUNTER — Other Ambulatory Visit: Payer: Self-pay | Admitting: *Deleted

## 2014-07-09 DIAGNOSIS — M5417 Radiculopathy, lumbosacral region: Secondary | ICD-10-CM

## 2014-07-09 DIAGNOSIS — M7130 Other bursal cyst, unspecified site: Secondary | ICD-10-CM

## 2014-07-09 MED ORDER — TRAMADOL HCL 50 MG PO TABS: 50.0000 mg | ORAL_TABLET | Freq: Two times a day (BID) | ORAL | Status: DC | PRN

## 2014-07-09 MED ORDER — GABAPENTIN 300 MG PO CAPS: 300.0000 mg | ORAL_CAPSULE | Freq: Every day | ORAL | Status: DC

## 2014-07-09 NOTE — Telephone Encounter (Signed)
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 We discussed this with patient today. Patient was given up her prescription of tramadol as well as a higher dose of the gabapentin at night to see if this would improve. Patient call if she needs anything else and has a muscle relaxers as needed.

## 2014-07-09 NOTE — Telephone Encounter (Signed)
Best number [phone]

## 2014-07-09 NOTE — Addendum Note (Signed)
Addended by: Judi Saa on: 07/09/2014 11:56 AM   Modules accepted: Orders

## 2014-07-12 ENCOUNTER — Telehealth: Payer: Self-pay | Admitting: Internal Medicine

## 2014-07-12 NOTE — Telephone Encounter (Signed)
Pt called and would like a call back from nurse about the order that was put in   Best number [phone]

## 2014-07-12 NOTE — Telephone Encounter (Signed)
Pt wanted to make sure that they are draining the cyst as well as giving her an injection. Advised pt that this is the plan.

## 2014-07-15 ENCOUNTER — Ambulatory Visit
Admission: RE | Admit: 2014-07-15 | Discharge: 2014-07-15 | Disposition: A | Discharge status: Home / Self Care | Payer: BC Managed Care – PPO | Source: Ambulatory Visit | Attending: Family Medicine | Admitting: Family Medicine

## 2014-07-15 DIAGNOSIS — M7130 Other bursal cyst, unspecified site: Secondary | ICD-10-CM

## 2014-07-15 DIAGNOSIS — M5417 Radiculopathy, lumbosacral region: Secondary | ICD-10-CM

## 2014-07-15 MED ORDER — DIAZEPAM 5 MG PO TABS
10.0000 mg | ORAL_TABLET | Freq: Once | ORAL | Status: AC
  Administered 2014-07-15: 10 mg via ORAL

## 2014-07-15 MED ORDER — IOHEXOL 180 MG/ML  SOLN
1.0000 mL | Freq: Once | INTRAMUSCULAR | Status: AC | PRN
  Administered 2014-07-15: 1 mL via EPIDURAL

## 2014-07-15 MED ORDER — METHYLPREDNISOLONE ACETATE 40 MG/ML INJ SUSP (RADIOLOG
120.0000 mg | Freq: Once | INTRAMUSCULAR | Status: AC
  Administered 2014-07-15: 120 mg via EPIDURAL

## 2014-07-15 NOTE — Discharge Instructions (Signed)

## 2014-07-22 ENCOUNTER — Encounter: Payer: Self-pay | Admitting: Family Medicine

## 2014-07-26 ENCOUNTER — Encounter: Payer: Self-pay | Admitting: Internal Medicine

## 2014-07-26 ENCOUNTER — Ambulatory Visit (INDEPENDENT_AMBULATORY_CARE_PROVIDER_SITE_OTHER): Payer: BC Managed Care – PPO | Admitting: Internal Medicine

## 2014-07-26 VITALS — BP 122/68 | HR 62 | Temp 98.3°F | Resp 14 | Ht 66.0 in | Wt 197.0 lb

## 2014-07-26 DIAGNOSIS — Z23 Encounter for immunization: Secondary | ICD-10-CM

## 2014-07-26 DIAGNOSIS — Z0000 Encounter for general adult medical examination without abnormal findings: Secondary | ICD-10-CM | POA: Diagnosis not present

## 2014-07-26 DIAGNOSIS — E669 Obesity, unspecified: Secondary | ICD-10-CM

## 2014-07-26 NOTE — Addendum Note (Signed)
Addended by: Conception ChancyOBERSON, AMY R on: 07/26/2014 09:23 AM   Modules accepted: Orders

## 2014-07-26 NOTE — Patient Instructions (Signed)
You are doing great with the weight loss! That is the hard work that will keep you from ever getting diabetes!  We will see you back in about 6 months and we can recheck the blood work then.   We did give you the tetanus shot today.  Health Maintenance Adopting a healthy lifestyle and getting preventive care can go a long way to promote health and wellness. Talk with your health care provider about what schedule of regular examinations is right for you. This is a good chance for you to check in with your provider about disease prevention and staying healthy. In between checkups, there are plenty of things you can do on your own. Experts have done a lot of research about which lifestyle changes and preventive measures are most likely to keep you healthy. Ask your health care provider for more information. WEIGHT AND DIET  Eat a healthy diet  Be sure to include plenty of vegetables, fruits, low-fat dairy products, and lean protein.  Do not eat a lot of foods high in solid fats, added sugars, or salt.  Get regular exercise. This is one of the most important things you can do for your health.  Most adults should exercise for at least 150 minutes each week. The exercise should increase your heart rate and make you sweat (moderate-intensity exercise).  Most adults should also do strengthening exercises at least twice a week. This is in addition to the moderate-intensity exercise.  Maintain a healthy weight  Body mass index (BMI) is a measurement that can be used to identify possible weight problems. It estimates body fat based on height and weight. Your health care provider can help determine your BMI and help you achieve or maintain a healthy weight.  For females 56 years of age and older:   A BMI below 18.5 is considered underweight.  A BMI of 18.5 to 24.9 is normal.  A BMI of 25 to 29.9 is considered overweight.  A BMI of 30 and above is considered obese.  Watch levels of  cholesterol and blood lipids  You should start having your blood tested for lipids and cholesterol at 56 years of age, then have this test every 5 years.  You may need to have your cholesterol levels checked more often if:  Your lipid or cholesterol levels are high.  You are older than 56 years of age.  You are at high risk for heart disease.  CANCER SCREENING   Lung Cancer  Lung cancer screening is recommended for adults 27-37 years old who are at high risk for lung cancer because of a history of smoking.  A yearly low-dose CT scan of the lungs is recommended for people who:  Currently smoke.  Have quit within the past 15 years.  Have at least a 30-pack-year history of smoking. A pack year is smoking an average of one pack of cigarettes a day for 1 year.  Yearly screening should continue until it has been 15 years since you quit.  Yearly screening should stop if you develop a health problem that would prevent you from having lung cancer treatment.  Breast Cancer  Practice breast self-awareness. This means understanding how your breasts normally appear and feel.  It also means doing regular breast self-exams. Let your health care provider know about any changes, no matter how small.  If you are in your 20s or 30s, you should have a clinical breast exam (CBE) by a health care provider every 1-3 years as  part of a regular health exam.  If you are 40 or older, have a CBE every year. Also consider having a breast X-ray (mammogram) every year.  If you have a family history of breast cancer, talk to your health care provider about genetic screening.  If you are at high risk for breast cancer, talk to your health care provider about having an MRI and a mammogram every year.  Breast cancer gene (BRCA) assessment is recommended for women who have family members with BRCA-related cancers. BRCA-related cancers include:  Breast.  Ovarian.  Tubal.  Peritoneal  cancers.  Results of the assessment will determine the need for genetic counseling and BRCA1 and BRCA2 testing. Cervical Cancer Routine pelvic examinations to screen for cervical cancer are no longer recommended for nonpregnant women who are considered low risk for cancer of the pelvic organs (ovaries, uterus, and vagina) and who do not have symptoms. A pelvic examination may be necessary if you have symptoms including those associated with pelvic infections. Ask your health care provider if a screening pelvic exam is right for you.   The Pap test is the screening test for cervical cancer for women who are considered at risk.  If you had a hysterectomy for a problem that was not cancer or a condition that could lead to cancer, then you no longer need Pap tests.  If you are older than 65 years, and you have had normal Pap tests for the past 10 years, you no longer need to have Pap tests.  If you have had past treatment for cervical cancer or a condition that could lead to cancer, you need Pap tests and screening for cancer for at least 20 years after your treatment.  If you no longer get a Pap test, assess your risk factors if they change (such as having a new sexual partner). This can affect whether you should start being screened again.  Some women have medical problems that increase their chance of getting cervical cancer. If this is the case for you, your health care provider may recommend more frequent screening and Pap tests.  The human papillomavirus (HPV) test is another test that may be used for cervical cancer screening. The HPV test looks for the virus that can cause cell changes in the cervix. The cells collected during the Pap test can be tested for HPV.  The HPV test can be used to screen women 72 years of age and older. Getting tested for HPV can extend the interval between normal Pap tests from three to five years.  An HPV test also should be used to screen women of any age who  have unclear Pap test results.  After 56 years of age, women should have HPV testing as often as Pap tests.  Colorectal Cancer  This type of cancer can be detected and often prevented.  Routine colorectal cancer screening usually begins at 56 years of age and continues through 56 years of age.  Your health care provider may recommend screening at an earlier age if you have risk factors for colon cancer.  Your health care provider may also recommend using home test kits to check for hidden blood in the stool.  A small camera at the end of a tube can be used to examine your colon directly (sigmoidoscopy or colonoscopy). This is done to check for the earliest forms of colorectal cancer.  Routine screening usually begins at age 61.  Direct examination of the colon should be repeated every 5-10  years through 56 years of age. However, you may need to be screened more often if early forms of precancerous polyps or small growths are found. Skin Cancer  Check your skin from head to toe regularly.  Tell your health care provider about any new moles or changes in moles, especially if there is a change in a mole's shape or color.  Also tell your health care provider if you have a mole that is larger than the size of a pencil eraser.  Always use sunscreen. Apply sunscreen liberally and repeatedly throughout the day.  Protect yourself by wearing long sleeves, pants, a wide-brimmed hat, and sunglasses whenever you are outside. HEART DISEASE, DIABETES, AND HIGH BLOOD PRESSURE   Have your blood pressure checked at least every 1-2 years. High blood pressure causes heart disease and increases the risk of stroke.  If you are between 39 years and 49 years old, ask your health care provider if you should take aspirin to prevent strokes.  Have regular diabetes screenings. This involves taking a blood sample to check your fasting blood sugar level.  If you are at a normal weight and have a low risk for  diabetes, have this test once every three years after 56 years of age.  If you are overweight and have a high risk for diabetes, consider being tested at a younger age or more often. PREVENTING INFECTION  Hepatitis B  If you have a higher risk for hepatitis B, you should be screened for this virus. You are considered at high risk for hepatitis B if:  You were born in a country where hepatitis B is common. Ask your health care provider which countries are considered high risk.  Your parents were born in a high-risk country, and you have not been immunized against hepatitis B (hepatitis B vaccine).  You have HIV or AIDS.  You use needles to inject street drugs.  You live with someone who has hepatitis B.  You have had sex with someone who has hepatitis B.  You get hemodialysis treatment.  You take certain medicines for conditions, including cancer, organ transplantation, and autoimmune conditions. Hepatitis C  Blood testing is recommended for:  Everyone born from [birth year] through 1967.  Anyone with known risk factors for hepatitis C. Sexually transmitted infections (STIs)  You should be screened for sexually transmitted infections (STIs) including gonorrhea and chlamydia if:  You are sexually active and are younger than 56 years of age.  You are older than 56 years of age and your health care provider tells you that you are at risk for this type of infection.  Your sexual activity has changed since you were last screened and you are at an increased risk for chlamydia or gonorrhea. Ask your health care provider if you are at risk.  If you do not have HIV, but are at risk, it may be recommended that you take a prescription medicine daily to prevent HIV infection. This is called pre-exposure prophylaxis (PrEP). You are considered at risk if:  You are sexually active and do not regularly use condoms or know the HIV status of your partner(s).  You take drugs by injection.  You are  sexually active with a partner who has HIV. Talk with your health care provider about whether you are at high risk of being infected with HIV. If you choose to begin PrEP, you should first be tested for HIV. You should then be tested every 3 months for as long as you are  taking PrEP.  PREGNANCY   If you are premenopausal and you may become pregnant, ask your health care provider about preconception counseling.  If you may become pregnant, take 400 to 800 micrograms (mcg) of folic acid every day.  If you want to prevent pregnancy, talk to your health care provider about birth control (contraception). OSTEOPOROSIS AND MENOPAUSE   Osteoporosis is a disease in which the bones lose minerals and strength with aging. This can result in serious bone fractures. Your risk for osteoporosis can be identified using a bone density scan.  If you are 70 years of age or older, or if you are at risk for osteoporosis and fractures, ask your health care provider if you should be screened.  Ask your health care provider whether you should take a calcium or vitamin D supplement to lower your risk for osteoporosis.  Menopause may have certain physical symptoms and risks.  Hormone replacement therapy may reduce some of these symptoms and risks. Talk to your health care provider about whether hormone replacement therapy is right for you.  HOME CARE INSTRUCTIONS   Schedule regular health, dental, and eye exams.  Stay current with your immunizations.   Do not use any tobacco products including cigarettes, chewing tobacco, or electronic cigarettes.  If you are pregnant, do not drink alcohol.  If you are breastfeeding, limit how much and how often you drink alcohol.  Limit alcohol intake to no more than 1 drink per day for nonpregnant women. One drink equals 12 ounces of beer, 5 ounces of wine, or 1 ounces of hard liquor.  Do not use street drugs.  Do not share needles.  Ask your health care provider for  help if you need support or information about quitting drugs.  Tell your health care provider if you often feel depressed.  Tell your health care provider if you have ever been abused or do not feel safe at home. Document Released: 07/31/2010 Document Revised: 06/01/2013 Document Reviewed: 12/17/2012 [facility] Patient Information 2015 [place], [place]. This information is not intended to replace advice given to you by your health care provider. Make sure you discuss any questions you have with your health care provider.

## 2014-07-26 NOTE — Progress Notes (Signed)
   Subjective:    Patient ID: Kirsten Campbell, female    DOB: 04/15/1960, 54 y.o.   MRN: [id number]  HPI The patient is a 54 YO female here for wellness. No new complaints. She has lost about 20 pounds since last visit per our advice. More exercise and better diet.   PMH, [facility], social history reviewed and updated.   Review of Systems  Constitutional: Negative for fever, activity change, appetite change and unexpected weight change.  HENT: Negative.   Eyes: Negative.   Respiratory: Negative for cough, chest tightness, shortness of breath and wheezing.   Cardiovascular: Negative for chest pain, palpitations and leg swelling.  Gastrointestinal: Negative for nausea, abdominal pain, diarrhea and abdominal distention.  Musculoskeletal: Negative for myalgias and back pain.  Skin: Negative.   Neurological: Negative.   Psychiatric/Behavioral: Negative.      Objective:   Physical Exam  Constitutional: She is oriented to person, place, and time. She appears well-developed and well-nourished.  HENT:  Head: Normocephalic and atraumatic.  Eyes: EOM are normal.  Neck: Normal range of motion.  Cardiovascular: Normal rate and regular rhythm.   No murmur heard. Pulmonary/Chest: Effort normal and breath sounds normal.  Abdominal: Soft.  Musculoskeletal: She exhibits no edema.  Neurological: She is alert and oriented to person, place, and time. Coordination normal.  Skin: Skin is warm and dry.  Psychiatric: She has a normal mood and affect.   Filed Vitals:   07/26/14 0842  BP: 122/68  Pulse: 62  Temp: 98.3 F (36.8 C)  TempSrc: Oral  Resp: 14  Height: 5\' 6"  (1.676 m)  Weight: 197 lb (89.359 kg)  SpO2: 98%      Assessment & Plan:  Tdap given at visit

## 2014-07-26 NOTE — Assessment & Plan Note (Signed)
Tdap given at visit, gets yearly flu shot. Has lost 26 pounds since last visit! Congratulated her on that (her goal is to lose about 20 more pounds). Up to date on colon cancer and breast cancer screening. Pap smear up to date.

## 2014-07-26 NOTE — Assessment & Plan Note (Signed)
Down 26 pounds since last visit! Encouraged her to continue with healthy lifestyle and work on maintaining. She wants to lose about 20 more pounds which is okay as she is still over her goal weight.

## 2014-07-26 NOTE — Progress Notes (Signed)
Pre visit review using our clinic review tool, if applicable. No additional management support is needed unless otherwise documented below in the visit note. 

## 2014-07-27 ENCOUNTER — Encounter: Payer: Self-pay | Admitting: Family Medicine

## 2014-08-09 ENCOUNTER — Other Ambulatory Visit: Payer: Self-pay

## 2014-08-09 DIAGNOSIS — Z1231 Encounter for screening mammogram for malignant neoplasm of breast: Secondary | ICD-10-CM

## 2014-08-11 ENCOUNTER — Encounter: Payer: Self-pay | Admitting: Family Medicine

## 2014-08-11 ENCOUNTER — Telehealth: Payer: Self-pay | Admitting: Family Medicine

## 2014-08-11 ENCOUNTER — Other Ambulatory Visit: Payer: Self-pay

## 2014-08-11 MED ORDER — TRAMADOL HCL 50 MG PO TABS: 50.0000 mg | ORAL_TABLET | Freq: Three times a day (TID) | ORAL | Status: DC | PRN

## 2014-08-11 NOTE — Telephone Encounter (Signed)
States pain has came back at night.  Left leg is cramping really bad.  Starts at base of kee and goes down to foot and back.  States last 5 minutes.  Can reach at work [phone].

## 2014-08-11 NOTE — Telephone Encounter (Signed)
Can reach at home until 10:00am.  Can reach from 11:00 on at work.

## 2014-08-16 ENCOUNTER — Telehealth: Payer: Self-pay | Admitting: Family Medicine

## 2014-08-16 ENCOUNTER — Other Ambulatory Visit: Payer: Self-pay

## 2014-08-16 NOTE — Telephone Encounter (Signed)
States new medicine was going fine until Saturday.  States she went to pick up at pharmacy and pharmacy would not fill script for 100mg  b/c stated it would be too much to take with 300mg  at night.  States scripts were not written correctly.  Patient uses [facility] on [address]

## 2014-08-16 NOTE — Telephone Encounter (Signed)
Left message for patient to return call to remedy prescription issue

## 2014-08-17 ENCOUNTER — Other Ambulatory Visit: Payer: Self-pay

## 2014-08-17 MED ORDER — GABAPENTIN 100 MG PO CAPS: 100.0000 mg | ORAL_CAPSULE | Freq: Three times a day (TID) | ORAL | Status: DC

## 2014-08-21 ENCOUNTER — Encounter: Payer: Self-pay | Admitting: Family Medicine

## 2014-08-24 ENCOUNTER — Encounter: Payer: Self-pay | Admitting: Family Medicine

## 2014-08-24 ENCOUNTER — Ambulatory Visit (INDEPENDENT_AMBULATORY_CARE_PROVIDER_SITE_OTHER): Payer: BC Managed Care – PPO | Admitting: Family Medicine

## 2014-08-24 VITALS — BP 112/72 | HR 72 | Ht 66.0 in | Wt 199.0 lb

## 2014-08-24 DIAGNOSIS — M5416 Radiculopathy, lumbar region: Secondary | ICD-10-CM

## 2014-08-24 DIAGNOSIS — M5417 Radiculopathy, lumbosacral region: Secondary | ICD-10-CM

## 2014-08-24 MED ORDER — TRAMADOL HCL 50 MG PO TABS: 50.0000 mg | ORAL_TABLET | Freq: Three times a day (TID) | ORAL | Status: DC | PRN

## 2014-08-24 MED ORDER — KETOROLAC TROMETHAMINE 30 MG/ML IJ SOLN
60.0000 mg | Freq: Once | INTRAMUSCULAR | Status: AC
  Administered 2014-08-24: 60 mg via INTRAMUSCULAR

## 2014-08-24 MED ORDER — METHYLPREDNISOLONE ACETATE 80 MG/ML IJ SUSP
80.0000 mg | Freq: Once | INTRAMUSCULAR | Status: AC
  Administered 2014-08-24: 80 mg via INTRAMUSCULAR

## 2014-08-24 MED ORDER — GABAPENTIN 400 MG PO CAPS: 400.0000 mg | ORAL_CAPSULE | Freq: Three times a day (TID) | ORAL | Status: DC

## 2014-08-24 NOTE — Assessment & Plan Note (Signed)
Patient is having a constant radicular symptom mostly of the left calf and posterior leg. This does correspond very well to the L5 radiculopathy. Patient did have the injection but I do think that this cyst continues to be concerning. Patient will increase the gabapentin to 400 mg 3 times daily. We discussed tramadol for breakthrough pain. We discussed icing. Patient will be referred to neurosurgery for further evaluation. Patient is having worsening weakness of the lower extremity which is concerning as well. Patient no signs and symptoms and when to seek medical attention. Patient can call me if there is any other needs at this time.  Spent  25 minutes with patient face-to-face and had greater than 50% of counseling including as described above in assessment and plan.

## 2014-08-24 NOTE — Progress Notes (Signed)
Pre visit review using our clinic review tool, if applicable. No additional management support is needed unless otherwise documented below in the visit note. 

## 2014-08-24 NOTE — Patient Instructions (Signed)
Good to see you Neuro surgery should be calling you Ice is your friend Gabapentin  in AM and PM and can do  at night Tramadol for breakthrough pain.  Call me when you need me.

## 2014-08-24 NOTE — Progress Notes (Signed)
  [name] [address] [place], [place] [address] Phone: [phone] Subjective:     CC:  left hip and leg pain follow-up  [id number] Kirsten Campbell is a 54 y.o. female coming in with complaint of  left hip and leg pain. Patient has been seen previously for a greater trochanteric bursitis. Patient continued to have pain and was having radicular symptoms going down the leg. Patient did have further workup including an MRI. 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 Patient is having worsening symptoms again though. Patient states that now it seems to be constant and has a severe pain of 9 out of 10. Waking her up at night. Patient states that she has had to increase her gabapentin to 800 mg in the morning to be able to function appropriately but then she does have some concentration difficulties. Patient states that it is very difficult to do activities of daily living as well as second due to this pain. Symptoms going up and down stairs can be difficult. Patient states standing after a long amount of time can feel some weakness in the left leg.     Past medical history, social, surgical and family history all reviewed in electronic medical record.   Review of Systems: No headache, visual changes, nausea, vomiting, diarrhea, constipation, dizziness, abdominal pain, skin rash, fevers, chills, night sweats, weight loss, swollen lymph nodes, body aches, joint swelling, muscle aches, chest pain, shortness of breath, mood changes.   Objective Blood pressure 112/72, pulse 72, height  (1.676 m), weight 199 lb (90.266 kg), SpO2 99 %.  General: No apparent distress alert and oriented x3 mood and affect normal, dressed appropriately.    HEENT: Pupils equal, extraocular movements intact  Respiratory: Patient's speak in full sentences and does not appear short of breath  Cardiovascular: No lower extremity edema, non tender, no erythema  Skin: Warm dry intact with no signs of infection or rash on extremities or on axial skeleton.  Abdomen: Soft nontender  Neuro: Cranial nerves II through XII are intact, neurovascularly intact in all extremities with 2+ DTRs and 2+ pulses.  Lymph: No lymphadenopathy of posterior or anterior cervical chain or axillae bilaterally.  Gait mild antalgic gait.  MSK:  Non tender with full range of motion and good stability and symmetric strength and tone of shoulders, elbows, wrist, hip, knee and ankles bilaterally. Patient does have some atrophy of the quadriceps and hamstring on the left leg compared to the contralateral side. Back Exam:  Inspection: Unremarkable  Motion: Flexion 45 deg, Extension 20 deg, Side Bending to 35 deg bilaterally,  Rotation to 35 deg bilaterally  SLR laying: Positive left side XSLR laying: Negative  Palpable tenderness: Pain over left greater trochanteric area FABER: Positive left Sensory change: Gross sensation intact to all lumbar and sacral dermatomes.  Reflexes: 2+ at both patellar tendons, 2+ at achilles tendons, Babinski's downgoing.  Strength at foot  Plantar-flexion: 5/5 Dorsi-flexion: 5/5 Eversion: 5/5 Inversion: 5/5  Leg strength  Quad: 5/5 Hamstring: 45 Hip flexor: 4/5 on left Hip abductors: 4/5       Impression and Recommendations:     This case required medical decision making of moderate complexity.

## 2014-09-01 ENCOUNTER — Encounter: Payer: Self-pay | Admitting: Family Medicine

## 2014-09-09 ENCOUNTER — Encounter: Payer: Self-pay | Admitting: *Deleted

## 2014-09-10 ENCOUNTER — Ambulatory Visit
Admission: RE | Admit: 2014-09-10 | Discharge: 2014-09-10 | Disposition: A | Discharge status: Home / Self Care | Payer: BC Managed Care – PPO | Source: Ambulatory Visit

## 2014-09-10 DIAGNOSIS — Z1231 Encounter for screening mammogram for malignant neoplasm of breast: Secondary | ICD-10-CM

## 2014-09-24 ENCOUNTER — Encounter: Payer: Self-pay | Admitting: Family Medicine

## 2014-10-01 ENCOUNTER — Encounter: Payer: Self-pay | Admitting: Cardiovascular Disease

## 2014-10-06 ENCOUNTER — Telehealth: Payer: Self-pay

## 2014-10-06 NOTE — Telephone Encounter (Signed)
MyChart number left was for daughter's cell phone. Will call back this afternoon, after 2:30pm, on her number on chart.

## 2014-11-02 ENCOUNTER — Telehealth: Payer: Self-pay | Admitting: Family Medicine

## 2014-11-02 NOTE — Telephone Encounter (Signed)
Patient states she is in a lot of pain in the morning and can not stand to walk in the morning.  She states she has increased her medication and this is not helping.  She has an appointment scheduled for Thursday but she is requesting to be worked in today.

## 2014-11-02 NOTE — Telephone Encounter (Signed)
Left detailed msg on pt's vmail making her aware we do not have any openings today or tomorrow & she would need to wait to see [place] on Thursday.

## 2014-11-04 ENCOUNTER — Other Ambulatory Visit (INDEPENDENT_AMBULATORY_CARE_PROVIDER_SITE_OTHER): Payer: BC Managed Care – PPO

## 2014-11-04 ENCOUNTER — Ambulatory Visit (INDEPENDENT_AMBULATORY_CARE_PROVIDER_SITE_OTHER): Payer: BC Managed Care – PPO | Admitting: Family Medicine

## 2014-11-04 ENCOUNTER — Encounter: Payer: Self-pay | Admitting: Family Medicine

## 2014-11-04 VITALS — BP 132/86 | HR 63 | Ht 66.0 in | Wt 197.0 lb

## 2014-11-04 DIAGNOSIS — M7550 Bursitis of unspecified shoulder: Secondary | ICD-10-CM | POA: Insufficient documentation

## 2014-11-04 DIAGNOSIS — M7551 Bursitis of right shoulder: Secondary | ICD-10-CM

## 2014-11-04 DIAGNOSIS — M7741 Metatarsalgia, right foot: Secondary | ICD-10-CM | POA: Diagnosis not present

## 2014-11-04 DIAGNOSIS — M25512 Pain in left shoulder: Secondary | ICD-10-CM | POA: Diagnosis not present

## 2014-11-04 DIAGNOSIS — M5417 Radiculopathy, lumbosacral region: Secondary | ICD-10-CM | POA: Diagnosis not present

## 2014-11-04 DIAGNOSIS — M7742 Metatarsalgia, left foot: Secondary | ICD-10-CM

## 2014-11-04 MED ORDER — TRAMADOL HCL 50 MG PO TABS: 50.0000 mg | ORAL_TABLET | Freq: Three times a day (TID) | ORAL | Status: DC | PRN

## 2014-11-04 MED ORDER — METHYLPREDNISOLONE ACETATE 80 MG/ML IJ SUSP
80.0000 mg | Freq: Once | INTRAMUSCULAR | Status: AC
  Administered 2014-11-04: 80 mg via INTRAMUSCULAR

## 2014-11-04 MED ORDER — KETOROLAC TROMETHAMINE 60 MG/2ML IM SOLN
60.0000 mg | Freq: Once | INTRAMUSCULAR | Status: AC
  Administered 2014-11-04: 60 mg via INTRAMUSCULAR

## 2014-11-04 NOTE — Progress Notes (Signed)
[name] [address] [place], [place] [address] Phone: [phone] Subjective:     CC:  left hip and leg pain follow-up, right shoulder pain  [id number] Kirsten Campbell is a 54 y.o. female coming in with complaint of  left hip and leg pain. Patient has been seen previously for a greater trochanteric bursitis. Patient continued to have pain and was having radicular symptoms going down the leg. Patient did have further workup including an MRI. 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 Patient was also given a facet injection at the same time. Patient was having severe pain and had one exacerbation but since then was doing much better. Patient did do a 5K walk recently unfortunate that seems to exacerbate the problem again. Radicular symptoms seem to be worsening. Continues to gabapentin 400 mg twice daily with mild improvement. Having some difficulty even with activities of daily living and being comfortable at night.  Patient is also having right shoulder pain. Describes it as more of a dull aching sensation that seems to get worse to the day and can be very uncomfortable at night. Denies any weakness. Denies any significant radiation going down the arm. Denies any associated neck pain. Patient rates the severity of pain a 7 out of 10. Does not remember any true injury since June when she did fall on that shoulder.  Patient is also having increasing foot pain. Patient has been found to have pes planus previously and was in custom orthotics. Custom orthotics are now proximal might 56 years old and feels like they are not giving her the relief she was having previously.     Past medical history, social, surgical and family history all reviewed  in electronic medical record.   Review of Systems: No headache, visual changes, nausea, vomiting, diarrhea, constipation, dizziness, abdominal pain, skin rash, fevers, chills, night sweats, weight loss, swollen lymph nodes, body aches, joint swelling, muscle aches, chest pain, shortness of breath, mood changes.   Objective Blood pressure 132/86, pulse 63, height  (1.676 m), weight 197 lb (89.359 kg), SpO2 98 %.  General: No apparent distress alert and oriented x3 mood and affect normal, dressed appropriately.  HEENT: Pupils equal, extraocular movements intact  Respiratory: Patient's speak in full sentences and does not appear short of breath  Cardiovascular: No lower extremity edema, non tender, no erythema  Skin: Warm dry intact with no signs of infection or rash on extremities or on axial skeleton.  Abdomen: Soft nontender  Neuro: Cranial nerves II through XII are intact, neurovascularly intact in all extremities with 2+ DTRs and 2+ pulses.  Lymph: No lymphadenopathy of posterior or anterior cervical chain or axillae bilaterally.  Gait mild antalgic gait.  MSK:  Non tender with full range of motion and good stability and symmetric strength and tone of shoulders, elbows, wrist, hip, knee and ankles bilaterally. Patient does have some atrophy of the quadriceps and hamstring on the left leg compared to the contralateral side but no worsening from previous exam  Foot exam shows the patient has severe breakdown of the longitudinal and transverse arch bilaterally right greater than left. Significant bunion and bunionette formation with mild fibular deviation of the first toes. Nontender on exam with full strength  of the ankles.  Back Exam:  Inspection: Unremarkable  Motion: Flexion 45 deg, Extension 20 deg, Side Bending to 35 deg bilaterally,  Rotation to 35 deg bilaterally  SLR laying: Positive left side still present XSLR laying: Negative  Palpable tenderness: Pain over left greater  trochanteric area FABER: Positive left Sensory change: Gross sensation intact to all lumbar and sacral dermatomes.  Reflexes: 2+ at both patellar tendons, 2+ at achilles tendons, Babinski's downgoing.  Strength at foot  Plantar-flexion: 5/5 Dorsi-flexion: 5/5 Eversion: 5/5 Inversion: 5/5  Leg strength  Quad: 5/5 Hamstring: 45 Hip flexor: 4/5 on left Hip abductors: 4/5   Overall stable physical exam on the back.  Shoulder: Right Inspection reveals no abnormalities, atrophy or asymmetry. Palpation is normal with no tenderness over AC joint or bicipital groove. ROM is full in all planes passively. Rotator cuff strength normal throughout. signs of impingement with positive Neer and Hawkin's tests, but negative empty can sign. Speeds and Yergason's tests normal. No labral pathology noted with negative Obrien's, negative clunk and good stability. Normal scapular function observed. No painful arc and no drop arm sign. No apprehension sign  MSK US performed of: Right This study was ordered, performed, and interpreted by Terrilee Files D.O.  Shoulder:   Supraspinatus:  Appears normal on long and transverse views, Bursal bulge seen with shoulder abduction on impingement view. Infraspinatus:  Appears normal on long and transverse views. Significant increase in Doppler flow Subscapularis:  Appears normal on long and transverse views. Positive bursa Teres Minor:  Appears normal on long and transverse views. AC joint:  Capsule undistended, no geyser sign. Glenohumeral Joint:  Appears normal without effusion. Glenoid Labrum:  Intact without visualized tears. Biceps Tendon:  Appears normal on long and transverse views, no fraying of tendon, tendon located in intertubercular groove, no subluxation with shoulder internal or external rotation.  Impression: Subacromial bursitis  Procedure: Real-time Ultrasound Guided Injection of right glenohumeral joint Device: GE Logiq E  Ultrasound guided  injection is preferred based studies that show increased duration, increased effect, greater accuracy, decreased procedural pain, increased response rate with ultrasound guided versus blind injection.  Verbal informed consent obtained.  Time-out conducted.  Noted no overlying erythema, induration, or other signs of local infection.  Skin prepped in a sterile fashion.  Local anesthesia: Topical Ethyl chloride.  With sterile technique and under real time ultrasound guidance:  Joint visualized.  23g 1  inch needle inserted posterior approach. Pictures taken for needle placement. Patient did have injection of 2 cc of 1% lidocaine, 2 cc of 0.5% Marcaine, and 1.0 cc of Kenalog 40 mg/dL. Completed without difficulty  Pain immediately resolved suggesting accurate placement of the medication.  Advised to call if fevers/chills, erythema, induration, drainage, or persistent bleeding.  Images permanently stored and available for review in the ultrasound unit.  Impression: Technically successful ultrasound guided injection.  Procedure note 97110; 15 minutes spent for Therapeutic exercises as stated in above notes.  This included exercises focusing on stretching, strengthening, with significant focus on eccentric aspects.Shoulder Exercises that included:  Basic scapular stabilization to include adduction and depression of scapula Scaption, focusing on proper movement and good control Internal and External rotation utilizing a theraband, with elbow tucked at side entire time Rows with theraband   Proper technique shown and discussed handout in great detail with ATC.  All questions were discussed and answered.      Impression and Recommendations:     This case required medical decision making of moderate complexity.

## 2014-11-04 NOTE — Assessment & Plan Note (Signed)
Orthotics are made over the year and a half ago. Has had breakdown. Do believe that this needs to be done again. Patient will be set up for no orthotics. This will help with alignment of the back as well.

## 2014-11-04 NOTE — Patient Instructions (Addendum)
Good to see you Thank you for all that you do.  Ice 20 minutes 2 times daily. Usually after activity and before bed. New exercises 3 times a week for the shoulder Vitamin D 2000 IU daily Gabapentin  twice daily Tramadol and take a tylenol with it.  See me again in  6 weeks or 2 weeks after epidural.  We will get the orthotics as well.

## 2014-11-04 NOTE — Assessment & Plan Note (Signed)
I do believe the patient has more of the radiculopathy again. There is a possibility of patient's cyst increasing in size. Patient did respond to the injection previously and I do think we should try this again. Patient still has reflexes that are all intact. No emergent signs noted. We discussed icing and continuing to stay active. Patient come back and see me again in 2 weeks after the epidural injections.

## 2014-11-04 NOTE — Assessment & Plan Note (Signed)
Patient given an injection today. Patient does have some mild osteophytic signs noted but very minimal. Patient did have complete resolution of pain medially. Home exercises given and patient work with Event organiser. Patient come back and see me again in 4 weeks for further evaluation.

## 2014-11-04 NOTE — Progress Notes (Signed)
Pre visit review using our clinic review tool, if applicable. No additional management support is needed unless otherwise documented below in the visit note. 

## 2014-11-05 ENCOUNTER — Telehealth: Payer: Self-pay | Admitting: Internal Medicine

## 2014-11-05 MED ORDER — PREDNISONE 50 MG PO TABS: ORAL_TABLET | ORAL | Status: DC

## 2014-11-05 NOTE — Telephone Encounter (Signed)
Per dr Katrinka Blazing, send in prednisone , 1 tab qd.  Pt made aware.

## 2014-11-05 NOTE — Telephone Encounter (Signed)
Pt states her injection did not work and wants to know what to do know. Please call her at [phone]

## 2014-11-10 ENCOUNTER — Ambulatory Visit: Payer: BC Managed Care – PPO | Admitting: Family Medicine

## 2014-11-12 ENCOUNTER — Ambulatory Visit
Admission: RE | Admit: 2014-11-12 | Discharge: 2014-11-12 | Disposition: A | Discharge status: Home / Self Care | Payer: BC Managed Care – PPO | Source: Ambulatory Visit | Attending: Family Medicine | Admitting: Family Medicine

## 2014-11-12 DIAGNOSIS — M5417 Radiculopathy, lumbosacral region: Secondary | ICD-10-CM

## 2014-11-12 MED ORDER — IOHEXOL 180 MG/ML  SOLN
1.0000 mL | Freq: Once | INTRAMUSCULAR | Status: DC | PRN
  Administered 2014-11-12: 1 mL via EPIDURAL

## 2014-11-12 MED ORDER — METHYLPREDNISOLONE ACETATE 40 MG/ML INJ SUSP (RADIOLOG
120.0000 mg | Freq: Once | INTRAMUSCULAR | Status: AC
  Administered 2014-11-12: 120 mg via EPIDURAL

## 2014-11-12 NOTE — Discharge Instructions (Signed)

## 2014-11-25 ENCOUNTER — Encounter: Payer: Self-pay | Admitting: Family Medicine

## 2014-11-25 ENCOUNTER — Telehealth: Payer: Self-pay

## 2014-11-25 NOTE — Telephone Encounter (Signed)
Left message for patient that we needed to reschedule her orthotics, I told her I would also send her a MyChart message regarding this so that we can reschedule her and hopefully be able to have her see Dr. Katrinka BlazingSmith the same day.

## 2014-11-29 ENCOUNTER — Ambulatory Visit: Payer: BC Managed Care – PPO | Admitting: Family Medicine

## 2014-12-07 ENCOUNTER — Ambulatory Visit (INDEPENDENT_AMBULATORY_CARE_PROVIDER_SITE_OTHER): Payer: BC Managed Care – PPO | Admitting: Family Medicine

## 2014-12-07 ENCOUNTER — Encounter: Payer: Self-pay | Admitting: Family Medicine

## 2014-12-07 DIAGNOSIS — M7742 Metatarsalgia, left foot: Secondary | ICD-10-CM | POA: Diagnosis not present

## 2014-12-07 DIAGNOSIS — M7741 Metatarsalgia, right foot: Secondary | ICD-10-CM | POA: Diagnosis not present

## 2014-12-07 NOTE — Progress Notes (Signed)
Patient was fitted for a : standard, cushioned, semi-rigid orthotic. The orthotic was heated and afterward the patient was in a seated position and the orthotic molded. The patient was positioned in subtalar neutral position and 10 degrees of ankle dorsiflexion in a non-weight bearing stance. After completion of molding, patient did have orthotic management which included instructions on acclimating to the orthotics, signs of ill fit as well as care for the orthotic.  I spent approximately with the patient properly fitting the orthotics as well as discussing transfers between shoes and care for the inserts.   The blank was ground to a stable position for weight bearing. Size: 10.5 (Igli Comfort)  Base: Carbon fiber Additional Posting and Padding: The following postings were fitted onto the molded orthotics to help maintain a talar neutral position - Wedge posting for transverse arch: None       Silicone posting for longitudinal arch: 250/100 x2 bilaterally  The patient ambulated these, and they were very comfortable and supportive.

## 2014-12-07 NOTE — Patient Instructions (Signed)
Acclimating to your Igli orthotics -  ° °We recommend that you allow up to 2 weeks for you to fully acclimate to your new custom orthotics. Please use the following recommended plan to build into full day wear.  ° °Day 1 - 2hours/day °Every day afterwards add 1 hour of wear(3hrs/day, 4hrs/day, etc) until you are able to wear them for an entire day without issues.  ° °If you notice any irritation or increasing discomfort with your new orthotics, please do not hesitate in contacting the office(leave a message for Shakil Dirk or Lindsay) or sending Mattix Imhof a message through MyChart to arrange a time to review your fit.  ° °Enjoy your new orthotics!!  ° °Kirsten Campbell ° ° ° °

## 2014-12-07 NOTE — Assessment & Plan Note (Signed)
Patient was putting custom orthotics today. Patient will slowly increase her wear over the course of time. Discussed continuing the home exercises in the icing protocol. Discussed the importance of good shoes. Patient come back in 4 weeks for further evaluation and treatment.

## 2014-12-08 ENCOUNTER — Other Ambulatory Visit: Payer: Self-pay | Admitting: Family Medicine

## 2014-12-08 NOTE — Telephone Encounter (Signed)
Refill done.  

## 2014-12-09 ENCOUNTER — Encounter: Payer: Self-pay | Admitting: Family Medicine

## 2014-12-10 ENCOUNTER — Encounter: Payer: Self-pay | Admitting: Family Medicine

## 2014-12-13 ENCOUNTER — Encounter: Payer: Self-pay | Admitting: Family Medicine

## 2014-12-14 ENCOUNTER — Other Ambulatory Visit: Payer: Self-pay | Admitting: Family Medicine

## 2014-12-15 ENCOUNTER — Other Ambulatory Visit: Payer: Self-pay | Admitting: *Deleted

## 2014-12-15 MED ORDER — TRAMADOL HCL 50 MG PO TABS: 50.0000 mg | ORAL_TABLET | Freq: Three times a day (TID) | ORAL | Status: DC | PRN

## 2014-12-16 ENCOUNTER — Other Ambulatory Visit: Payer: Self-pay | Admitting: Internal Medicine

## 2014-12-20 ENCOUNTER — Other Ambulatory Visit: Payer: Self-pay | Admitting: Family Medicine

## 2014-12-20 NOTE — Telephone Encounter (Signed)
Refill done.  

## 2015-01-11 ENCOUNTER — Telehealth: Payer: Self-pay | Admitting: Internal Medicine

## 2015-01-11 MED ORDER — HYDROXYZINE HCL 10 MG PO TABS: 10.0000 mg | ORAL_TABLET | Freq: Three times a day (TID) | ORAL | Status: DC | PRN

## 2015-01-11 NOTE — Telephone Encounter (Signed)
Sent in hydroxyzine, tell her to try it at home first, may make her sleepy.

## 2015-01-11 NOTE — Telephone Encounter (Signed)
Pt made aware

## 2015-01-11 NOTE — Telephone Encounter (Signed)
Pt is scheduled for surgery this Friday with a Dr. Yetta BarreJones and she is having some pretty bad anxiety. She's unable to sleep and can't eat and she's pretty sure its because of the surgery. The surgeon told her to contact you regarding getting something to help her with the anxiety.  Can you please call her at [phone]

## 2015-01-25 ENCOUNTER — Ambulatory Visit: Payer: BC Managed Care – PPO | Admitting: Internal Medicine

## 2015-02-17 ENCOUNTER — Other Ambulatory Visit (INDEPENDENT_AMBULATORY_CARE_PROVIDER_SITE_OTHER): Payer: BC Managed Care – PPO

## 2015-02-17 ENCOUNTER — Ambulatory Visit (INDEPENDENT_AMBULATORY_CARE_PROVIDER_SITE_OTHER): Payer: BC Managed Care – PPO | Admitting: Internal Medicine

## 2015-02-17 ENCOUNTER — Encounter: Payer: Self-pay | Admitting: Internal Medicine

## 2015-02-17 VITALS — BP 160/90 | HR 73 | Temp 98.0°F | Resp 14 | Ht 66.0 in | Wt 200.8 lb

## 2015-02-17 DIAGNOSIS — E119 Type 2 diabetes mellitus without complications: Secondary | ICD-10-CM | POA: Insufficient documentation

## 2015-02-17 DIAGNOSIS — I10 Essential (primary) hypertension: Secondary | ICD-10-CM | POA: Diagnosis not present

## 2015-02-17 DIAGNOSIS — E785 Hyperlipidemia, unspecified: Secondary | ICD-10-CM | POA: Diagnosis not present

## 2015-02-17 DIAGNOSIS — E669 Obesity, unspecified: Secondary | ICD-10-CM

## 2015-02-17 DIAGNOSIS — R7301 Impaired fasting glucose: Secondary | ICD-10-CM | POA: Diagnosis not present

## 2015-02-17 LAB — COMPREHENSIVE METABOLIC PANEL
ALT: 11 U/L (ref 0–35)
AST: 13 U/L (ref 0–37)
Albumin: 4.1 g/dL (ref 3.5–5.2)
Alkaline Phosphatase: 38 U/L — ABNORMAL LOW (ref 39–117)
BUN: 15 mg/dL (ref 6–23)
CO2: 31 mEq/L (ref 19–32)
Calcium: 10 mg/dL (ref 8.4–10.5)
Chloride: 98 mEq/L (ref 96–112)
Creatinine, Ser: 0.93 mg/dL (ref 0.40–1.20)
GFR: 79.94 mL/min (ref >60.00)
Glucose, Bld: 94 mg/dL (ref 70–99)
Potassium: 3.3 mEq/L — ABNORMAL LOW (ref 3.5–5.1)
Sodium: 138 mEq/L (ref 135–145)
Total Bilirubin: 0.3 mg/dL (ref 0.2–1.2)
Total Protein: 7.6 g/dL (ref 6.0–8.3)

## 2015-02-17 LAB — LIPID PANEL
Cholesterol: 264 mg/dL — ABNORMAL HIGH (ref 0–200)
HDL: 84 mg/dL (ref >39.00)
LDL Cholesterol: 153 mg/dL — ABNORMAL HIGH (ref 0–99)
NonHDL: 180.02
Total CHOL/HDL Ratio: 3
Triglycerides: 133 mg/dL (ref 0.0–149.0)
VLDL: 26.6 mg/dL (ref 0.0–40.0)

## 2015-02-17 LAB — HEMOGLOBIN A1C: Hgb A1c MFr Bld: 6.2 % (ref 4.6–6.5)

## 2015-02-17 MED ORDER — LOSARTAN POTASSIUM-HCTZ 100-12.5 MG PO TABS: 1.0000 | ORAL_TABLET | Freq: Every day | ORAL | Status: DC

## 2015-02-17 NOTE — Assessment & Plan Note (Signed)
Weight is up slightly from last visit. Talked to her about her food and she also will be able to do more exercise once her back is feeling better.

## 2015-02-17 NOTE — Assessment & Plan Note (Signed)
Switch ararbi-chlor to losartan/hctz for better price, also on amlodipine and verapamil. BP generally well controlled and may be up from back pain still. Adjust if needed next visit.

## 2015-02-17 NOTE — Patient Instructions (Signed)
We have sent in the medicine to replace the blood pressure medicine.   It is called losartan/hctz and is 1 pill a day. It should be much cheaper than your medicine is now.   If you are still having the headaches in a week call us for advice.   We are checking the blood work today and call you back with the results.  Exercising to Stay Healthy Exercising regularly is important. It has many health benefits, such as:  Improving your overall fitness, flexibility, and endurance.  Increasing your bone density.  Helping with weight control.  Decreasing your body fat.  Increasing your muscle strength.  Reducing stress and tension.  Improving your overall health. In order to become healthy and stay healthy, it is recommended that you do moderate-intensity and vigorous-intensity exercise. You can tell that you are exercising at a moderate intensity if you have a higher heart rate and faster breathing, but you are still able to hold a conversation. You can tell that you are exercising at a vigorous intensity if you are breathing much harder and faster and cannot hold a conversation while exercising. HOW OFTEN SHOULD I EXERCISE? Choose an activity that you enjoy and set realistic goals. Your health care provider can help you to make an activity plan that works for you. Exercise regularly as directed by your health care provider. This may include:   Doing resistance training twice each week, such as:  Push-ups.  Sit-ups.  Lifting weights.  Using resistance bands.  Doing a given intensity of exercise for a given amount of time. Choose from these options:  150 minutes of moderate-intensity exercise every week.  75 minutes of vigorous-intensity exercise every week.  A mix of moderate-intensity and vigorous-intensity exercise every week. Children, pregnant women, people who are out of shape, people who are overweight, and older adults may need to consult a health care provider for  individual recommendations. If you have any sort of medical condition, be sure to consult your health care provider before starting a new exercise program.  WHAT ARE SOME EXERCISE IDEAS? Some moderate-intensity exercise ideas include:   Walking at a rate of 1 mile in 15 minutes.  Biking.  Hiking.  Golfing.  Dancing. Some vigorous-intensity exercise ideas include:   Walking at a rate of at least 4.5 miles per hour.  Jogging or running at a rate of 5 miles per hour.  Biking at a rate of at least 10 miles per hour.  Lap swimming.  Roller-skating or in-line skating.  Cross-country skiing.  Vigorous competitive sports, such as football, basketball, and soccer.  Jumping rope.  Aerobic dancing. WHAT ARE SOME EVERYDAY ACTIVITIES THAT CAN HELP ME TO GET EXERCISE?  Yard work, such as:  Child psychotherapist.  Raking and bagging leaves.  Washing and waxing your car.  Pushing a stroller.  Shoveling snow.  Gardening.  Washing windows or floors. HOW CAN I BE MORE ACTIVE IN MY DAY-TO-DAY ACTIVITIES?  Use the stairs instead of the elevator.  Take a walk during your lunch break.  If you drive, park your car farther away from work or school.  If you take public transportation, get off one stop early and walk the rest of the way.  Make all of your phone calls while standing up and walking around.  Get up, stretch, and walk around every 30 minutes throughout the day. WHAT GUIDELINES SHOULD I FOLLOW WHILE EXERCISING?  Do not exercise so much that you hurt yourself, feel dizzy,  or get very short of breath.  Consult your health care provider before starting a new exercise program.  Wear comfortable clothes and shoes with good support.  Drink plenty of water while you exercise to prevent dehydration or heat stroke. Body water is lost during exercise and must be replaced.  Work out until you breathe faster and your heart beats faster.   This information is not intended  to replace advice given to you by your health care provider. Make sure you discuss any questions you have with your health care provider.   Document Released: 02/17/2010 Document Revised: 02/05/2014 Document Reviewed: 06/18/2013 Elsevier Interactive Patient Education [employer]

## 2015-02-17 NOTE — Progress Notes (Signed)
   Subjective:    Patient ID: Kirsten Campbell, female    DOB: Jan 30, 1960, 55 y.o.   MRN: [id number]  HPI The patient is a 55 YO female coming in for follow up of her medical problems including her weight (she is up several pounds since last visit, not taking medicine for weight), her blood pressure up today with stress and did not take her meds this morning but generally well controlled, taking amlodipine and verapamil and adarbi-chlor (will be out of pills tomorrow and very expensive) and her sugars (were impaired in the past, not exercising now). No new problems. Had a back surgery in December and is still recovering.   Review of Systems  Constitutional: Negative for fever, activity change, appetite change and unexpected weight change.  HENT: Negative.   Eyes: Negative.   Respiratory: Negative for cough, chest tightness, shortness of breath and wheezing.   Cardiovascular: Negative for chest pain, palpitations and leg swelling.  Gastrointestinal: Negative for nausea, abdominal pain, diarrhea and abdominal distention.  Musculoskeletal: Negative for myalgias and back pain.  Skin: Negative.   Neurological: Negative.   Psychiatric/Behavioral: Negative.       Objective:   Physical Exam  Constitutional: She is oriented to person, place, and time. She appears well-developed and well-nourished.  HENT:  Head: Normocephalic and atraumatic.  Eyes: EOM are normal.  Neck: Normal range of motion.  Cardiovascular: Normal rate and regular rhythm.   No murmur heard. Pulmonary/Chest: Effort normal and breath sounds normal.  Abdominal: Soft. She exhibits no distension. There is no tenderness. There is no rebound.  Musculoskeletal: She exhibits no edema.  Neurological: She is alert and oriented to person, place, and time. Coordination normal.  Skin: Skin is warm and dry.  Psychiatric: She has a normal mood and affect.   Filed Vitals:   02/17/15 0822 02/17/15 0856  BP: 172/90 160/90  Pulse: 73     Temp: 98 F (36.7 C)   TempSrc: Oral   Resp: 14   Height:  (1.676 m)   Weight: 200 lb 12.8 oz (91.082 kg)   SpO2: 98%       Assessment & Plan:

## 2015-02-17 NOTE — Assessment & Plan Note (Signed)
Checking HgA1c today and adjust as needed. Last was 6.4 and reminded her that she was on the verge of diabetes and she needs to work on weight and exercise to help avoid getting diabetes.

## 2015-02-17 NOTE — Progress Notes (Signed)
Pre visit review using our clinic review tool, if applicable. No additional management support is needed unless otherwise documented below in the visit note. 

## 2015-02-17 NOTE — Assessment & Plan Note (Signed)
Out of fenofibrate for about a week now and will check lipid panel and adjust as needed.

## 2015-02-22 ENCOUNTER — Encounter: Payer: Self-pay | Admitting: Internal Medicine

## 2015-02-23 ENCOUNTER — Telehealth: Payer: Self-pay | Admitting: Internal Medicine

## 2015-02-23 NOTE — Telephone Encounter (Signed)
Pt called in said that she sent an email that her head is still hurting.  She is not sure what to do?

## 2015-02-24 MED ORDER — FLUTICASONE PROPIONATE 50 MCG/ACT NA SUSP: 2.0000 | Freq: Every day | NASAL | Status: DC

## 2015-02-24 NOTE — Addendum Note (Signed)
Addended by: Hillard Danker A on: 02/24/2015 09:26 AM   Modules accepted: Orders

## 2015-02-24 NOTE — Telephone Encounter (Signed)
Patient states Kirsten Campbell has sent a message through my chart and has called previously.  Kirsten Campbell is requesting a call back in regards to last OV.  Would like to know what Dr. Okey Dupre can do for her headaches.

## 2015-02-24 NOTE — Telephone Encounter (Signed)
Done in mychart

## 2015-03-11 ENCOUNTER — Telehealth: Payer: Self-pay

## 2015-03-11 MED ORDER — AMLODIPINE BESYLATE 5 MG PO TABS: 5.0000 mg | ORAL_TABLET | Freq: Every day | ORAL | Status: DC

## 2015-03-11 NOTE — Telephone Encounter (Signed)
Received rx request from [facility] for amlodipine---i have sent rx to pharm

## 2015-03-18 ENCOUNTER — Telehealth: Payer: Self-pay | Admitting: Internal Medicine

## 2015-03-18 MED ORDER — AMLODIPINE BESYLATE 10 MG PO TABS: 10.0000 mg | ORAL_TABLET | Freq: Every day | ORAL | Status: DC

## 2015-03-18 NOTE — Telephone Encounter (Signed)
Pt states she has been talking with you regarding her bp.  She has been checking it over the last 3 days and it is still up. She has been taking her medication. She hasn't had any headaches but she has had a couple of dizzy spells this morning. Can you please call her on her cell. She says it will be with her but if you don't get through, go ahead and call her work #

## 2015-03-18 NOTE — Telephone Encounter (Signed)
We will increase her amlodipine to 10 mg daily, have her start taking 2 pills daily. We will send in a new prescription so when she refills it they can give her the higher strength.

## 2015-03-18 NOTE — Telephone Encounter (Signed)
Patient says her pressure has been up over 160 for the last 3 days, she is having headaches, but not as bad as they were. She says she is feeling a little dizzy today. Do we need to adjust her medications, or have her come in for a visit? Please advise, thanks.

## 2015-03-18 NOTE — Telephone Encounter (Signed)
Patient aware and will start taking 10 mg daily of amlodipine. She will keep a check on her blood pressure and call us if it stays high.

## 2015-04-19 ENCOUNTER — Other Ambulatory Visit: Payer: Self-pay | Admitting: Internal Medicine

## 2015-04-19 ENCOUNTER — Telehealth: Payer: Self-pay | Admitting: Internal Medicine

## 2015-04-19 MED ORDER — VERAPAMIL HCL ER 180 MG PO TBCR: 180.0000 mg | EXTENDED_RELEASE_TABLET | Freq: Every day | ORAL | Status: DC

## 2015-04-19 NOTE — Telephone Encounter (Signed)
Pt called request refill verapamil (CALAN-SR) 180 MG CR tablet to be send to [facility]. Pt is out of this med and drug store is trying to get in touch with us since Sat but never heard anything back

## 2015-04-19 NOTE — Telephone Encounter (Signed)
Sent in

## 2015-04-19 NOTE — Telephone Encounter (Signed)
Left detail massage on vm.

## 2015-05-10 MED FILL — FLUCONAZOLE 150 MG TABLET: 150 | 6 days supply | Qty: 3 | Fill #0

## 2015-05-10 MED FILL — ESTRADIOL 2 MG TABLET: 2 | 90 days supply | Qty: 90 | Fill #0

## 2015-07-04 MED FILL — ESTRADIOL 2 MG TABLET: 2 | 90 days supply | Qty: 180 | Fill #0

## 2015-08-16 ENCOUNTER — Other Ambulatory Visit: Payer: Self-pay | Admitting: Obstetrics & Gynecology

## 2015-08-16 DIAGNOSIS — Z139 Encounter for screening, unspecified: Secondary | ICD-10-CM

## 2015-08-17 ENCOUNTER — Other Ambulatory Visit (INDEPENDENT_AMBULATORY_CARE_PROVIDER_SITE_OTHER): Payer: BC Managed Care – PPO

## 2015-08-17 ENCOUNTER — Ambulatory Visit (INDEPENDENT_AMBULATORY_CARE_PROVIDER_SITE_OTHER): Payer: BC Managed Care – PPO | Admitting: Internal Medicine

## 2015-08-17 ENCOUNTER — Encounter: Payer: Self-pay | Admitting: Internal Medicine

## 2015-08-17 VITALS — BP 164/80 | HR 60 | Temp 98.1°F | Resp 12 | Ht 66.0 in | Wt 205.0 lb

## 2015-08-17 DIAGNOSIS — I10 Essential (primary) hypertension: Secondary | ICD-10-CM | POA: Diagnosis not present

## 2015-08-17 DIAGNOSIS — R7301 Impaired fasting glucose: Secondary | ICD-10-CM

## 2015-08-17 DIAGNOSIS — R0600 Dyspnea, unspecified: Secondary | ICD-10-CM

## 2015-08-17 LAB — COMPREHENSIVE METABOLIC PANEL
ALT: 14 U/L (ref 0–35)
AST: 15 U/L (ref 0–37)
Albumin: 3.8 g/dL (ref 3.5–5.2)
Alkaline Phosphatase: 55 U/L (ref 39–117)
BUN: 11 mg/dL (ref 6–23)
CO2: 28 mEq/L (ref 19–32)
Calcium: 9.5 mg/dL (ref 8.4–10.5)
Chloride: 102 mEq/L (ref 96–112)
Creatinine, Ser: 0.79 mg/dL (ref 0.40–1.20)
GFR: 96.34 mL/min (ref >60.00)
Glucose, Bld: 91 mg/dL (ref 70–99)
Potassium: 3.5 mEq/L (ref 3.5–5.1)
Sodium: 138 mEq/L (ref 135–145)
Total Bilirubin: 0.3 mg/dL (ref 0.2–1.2)
Total Protein: 7.2 g/dL (ref 6.0–8.3)

## 2015-08-17 LAB — BRAIN NATRIURETIC PEPTIDE: Pro B Natriuretic peptide (BNP): 17 pg/mL (ref 0.0–100.0)

## 2015-08-17 LAB — HEMOGLOBIN A1C: Hgb A1c MFr Bld: 5.9 % (ref 4.6–6.5)

## 2015-08-17 NOTE — Progress Notes (Signed)
Pre visit review using our clinic review tool, if applicable. No additional management support is needed unless otherwise documented below in the visit note. 

## 2015-08-17 NOTE — Progress Notes (Signed)
   Subjective:    Patient ID: Kirsten Campbell, female    DOB: 08-11-1960, 55 y.o.   MRN: [id number]  HPI The patient is a 55 YO female coming in for follow up of her blood pressure (has not taken her meds this morning but overall BP is well controlled at home, taking verapamil, losartan/hctz, not complicated) and her sugars (they are pre-diabetic and she is trying to exercise but has not been good lately since the weather has been hot). No new complaints.   Review of Systems  Constitutional: Negative for fever, activity change, appetite change and unexpected weight change.  Respiratory: Negative for cough, chest tightness, shortness of breath and wheezing.   Cardiovascular: Negative for chest pain, palpitations and leg swelling.  Gastrointestinal: Negative for nausea, abdominal pain, diarrhea and abdominal distention.  Musculoskeletal: Negative for myalgias and back pain.  Skin: Negative.   Neurological: Negative.   Psychiatric/Behavioral: Negative.       Objective:   Physical Exam  Constitutional: She is oriented to person, place, and time. She appears well-developed and well-nourished.  HENT:  Head: Normocephalic and atraumatic.  Eyes: EOM are normal.  Neck: Normal range of motion.  Cardiovascular: Normal rate and regular rhythm.   No murmur heard. Pulmonary/Chest: Effort normal and breath sounds normal.  Abdominal: Soft. She exhibits no distension. There is no tenderness. There is no rebound.  Musculoskeletal: She exhibits no edema.  Neurological: She is alert and oriented to person, place, and time. Coordination normal.  Skin: Skin is warm and dry.   Filed Vitals:   08/17/15 0834  BP: 164/80  Pulse: 60  Temp: 98.1 F (36.7 C)  TempSrc: Oral  Resp: 12  Height: 5\' 6"  (1.676 m)  Weight: 205 lb (92.987 kg)  SpO2: 98%      Assessment & Plan:

## 2015-08-17 NOTE — Patient Instructions (Signed)
We are checking the labs today and will send the results.   Work on the diet to help keep the weight the same or go down some. Every 1 pound you lose takes 4 pounds of pressure off your knees.   Serving Sizes A serving size is a measured amount of food or drink, such as one slice of bread, that has an associated nutrient content. Knowing the serving size of a food or drink can help you determine how much of that food you should consume.  WHAT IS THE SIZE OF ONE SERVING? The size of one healthy serving depends on the food or drink. To determine a serving size, read the food label. If the food or drink does not have a food label, try to find serving size information online. Or, use the following to estimate the size of one adult serving:  Grain 1 slice bread.  bagel.  cup pasta.  Vegetable  cup cooked or canned vegetables. 1 cup raw, leafy greens.  Fruit  cup canned fruit. 1 medium fruit.  cup dried fruit.  Meat and Other Protein Sources 1 oz meat, poultry, or fish.  cup cooked beans. 1 egg.  cup nuts or seeds. 1 Tbsp nut butter.  cup tofu or tempeh. 2 Tbsp hummus.  Dairy An individual container of yogurt (6-8 oz). 1 piece of cheese the size of your thumb (1 oz). 1 cup (8 oz) milk or milk alternative.  Fat A piece the size of one dice. 1 tsp soft margarine. 1 Tbsp mayonnaise. 1 tsp vegetable oil. 1 Tbsp regular salad dressing. 2 Tbsp low-fat salad dressing.  HOW MANY SERVINGS SHOULD I EAT FROM EACH FOOD GROUP EACH DAY?  The following are the suggested number of servings to try and have every day from each food group. You can also look at your eating throughout the week and aim for meeting these requirements on most days for overall healthy eating.  Grain 6-8 servings. Try to have half of your grains from whole grains, such as whole wheat bread, corn tortillas, oatmeal, brown rice, whole wheat pasta, and bulgur. Vegetable At least 2-3 servings.  Fruit 2 servings.  Meat and Other  Protein Foods 5-6 servings. Aim to have lean proteins, such as chicken, [place], fish, beans, or tofu. Dairy 3 servings. Choose low-fat or nonfat if you are trying to control your weight.  Fat 2-3 servings.  IS A SERVING THE SAME THING AS A PORTION? No. A portion is the actual amount you eat, which may be more than one serving. Knowing the specific serving size of a food and the nutritional information that goes with it can help you make a healthy decision on what size portion to eat.  WHAT ARE SOME TIPS TO HELP ME LEARN HEALTHY SERVING SIZES?  Check food labels for serving sizes. Many foods that come as a single portion actually contain multiple servings.  Determine the serving size of foods you commonly eat and figure out how large a portion you usually eat.  Measure the number of servings that can be held by the bowls, glasses, cups, and plates you typically use. For example, pour your breakfast cereal into your regular bowl and then pour it into a measuring cup.  For 1-2 days, measure the serving sizes of all the foods you eat.  Practice estimating serving sizes and determining how big your portions should be.   This information is not intended to replace advice given to you by your health care provider.  Make sure you discuss any questions you have with your health care provider.   Document Released: 10/14/2002 Document Revised: 02/05/2014 Document Reviewed: 04/14/2013 Elsevier Interactive Patient Education [employer] Elsevier Inc.

## 2015-08-19 NOTE — Assessment & Plan Note (Signed)
Checking HgA1c today and adjust as needed. Talked to her about the importance of diet and exercise.

## 2015-08-19 NOTE — Assessment & Plan Note (Signed)
BP mildly elevated due to not taking meds this morning and reminded her that she needs to take her meds regularly. Previous BP at goal on her meds. She will monitor at home.

## 2015-09-13 ENCOUNTER — Ambulatory Visit
Admission: RE | Admit: 2015-09-13 | Discharge: 2015-09-13 | Disposition: A | Discharge status: Home / Self Care | Payer: BC Managed Care – PPO | Source: Ambulatory Visit | Attending: Obstetrics & Gynecology | Admitting: Obstetrics & Gynecology

## 2015-09-13 DIAGNOSIS — Z139 Encounter for screening, unspecified: Secondary | ICD-10-CM

## 2015-09-13 NOTE — Progress Notes (Signed)
  Tawana ScaleZach Braden Cimo D.O. [facility] [address] [place], [place] [address] Phone: [phone] Subjective:     CC:  Right knee pain follow-up  BJY:[id number]:Subjective  Kirsten Campbell is a 55 y.o. female coming in with complaint of right knee and the pain  Regarding patient's right knee pain. Patient has known arthritic changes of the knee. Patient has been wearing them patellar strap and as well as a patellofemoral brace. States that that has been helpful but that does not seem to be helping as much. Having increasing instability. Affecting daily activities. Unable to walk long distances secondary to the pain. Sometimes associated with some swelling. Less than patient was seen 2 years ago.   Past Medical History:  Diagnosis Date  . Asthma   . Chicken pox   . Heart murmur   . Hyperlipidemia   . Hypertension    Past Surgical History:  Procedure Laterality Date  . ABDOMINAL HYSTERECTOMY  1995   Social History  Substance Use Topics  . Smoking status: Never Smoker  . Smokeless tobacco: Never Used  . Alcohol use No   Allergies  Allergen Reactions  . Penicillins Hives   Family History  Problem Relation Age of Onset  . Cancer Mother     Breast  . Hypertension Mother   . Hypertension Father   . Hypertension Sister   . Hypertension Brother   . Heart murmur Child        Past medical history, social, surgical and family history all reviewed in electronic medical record.   Review of Systems: No headache, visual changes, nausea, vomiting, diarrhea, constipation, dizziness, abdominal pain, skin rash, fevers, chills, night sweats, weight loss, swollen lymph nodes, body aches, joint swelling, muscle aches, chest pain, shortness of breath, mood changes.   Objective  There were no vitals taken for this visit.  General: No apparent distress alert and oriented x3 mood and affect normal, dressed appropriately.  HEENT: Pupils equal, extraocular movements intact  Respiratory:  Patient's speak in full sentences and does not appear short of breath  Cardiovascular: No lower extremity edema, non tender, no erythema  Skin: Warm dry intact with no signs of infection or rash on extremities or on axial skeleton.  Abdomen: Soft nontender  Neuro: Cranial nerves II through XII are intact, neurovascularly intact in all extremities with 2+ DTRs and 2+ pulses.  Lymph: No lymphadenopathy of posterior or anterior cervical chain or axillae bilaterally.  Gait normal with good balance and coordination.  MSK:  Non tender with full range of motion and good stability and symmetric strength and tone of shoulders, elbows, wrist, hip, and ankles bilaterally.  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 Significant thigh to calf ratio Patient is minimally tender to palpation over the medial joint line otherwise unremarkable ROM full in flexion and extension and lower leg rotation. Instability noted with valgus force Positive Mcmurray's, Apley's, and Thessalonian tests. Non painful patellar compression. Patellar glide with mild crepitus. Patellar and quadriceps tendons unremarkable. Hamstring and quadriceps strength is normal. Contralateral knee unremarkable Worsening symptoms from previous exam.      Impression and Recommendations:     This case required medical decision making of moderate complexity.

## 2015-09-14 ENCOUNTER — Encounter: Payer: Self-pay | Admitting: Family Medicine

## 2015-09-14 ENCOUNTER — Ambulatory Visit (INDEPENDENT_AMBULATORY_CARE_PROVIDER_SITE_OTHER): Payer: BC Managed Care – PPO | Admitting: Family Medicine

## 2015-09-14 DIAGNOSIS — M1711 Unilateral primary osteoarthritis, right knee: Secondary | ICD-10-CM | POA: Diagnosis not present

## 2015-09-14 NOTE — Patient Instructions (Addendum)
Good to see you  Ice is your friend pennsaid pinkie amount topically 2 times daily as needed.  We will get you a custom brace and I think it will help Stay active For the wrist I would wear the brace at night.  Can try the cream there as well.  See me again after the walk to see how you are doing.

## 2015-09-14 NOTE — Assessment & Plan Note (Signed)
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 We discussed icing regimen and home exercises. We discussed which activities to do in which ones to potentially avoid. We discussed that I do feel a stability brace would be more beneficial and I think custom one being more helpful. Patient will have this made. Patient given topical anti-inflammatories and will try this. I do believe with the instability of the knee she is more likely a fall risk at this point and has had difficult he with her back previously. We will monitor an have her follow-up again in 4-6 weeks.  Spent  25 minutes with patient face-to-face and had greater than 50% of counseling including as described above in assessment and plan.

## 2015-09-28 MED FILL — ESTRADIOL 2 MG TABLET: 2 | 90 days supply | Qty: 180 | Fill #1

## 2015-09-29 ENCOUNTER — Encounter: Payer: Self-pay | Admitting: Internal Medicine

## 2015-09-29 ENCOUNTER — Ambulatory Visit (INDEPENDENT_AMBULATORY_CARE_PROVIDER_SITE_OTHER): Payer: BC Managed Care – PPO | Admitting: Internal Medicine

## 2015-09-29 ENCOUNTER — Other Ambulatory Visit (INDEPENDENT_AMBULATORY_CARE_PROVIDER_SITE_OTHER): Payer: BC Managed Care – PPO

## 2015-09-29 VITALS — BP 136/72 | HR 73 | Temp 98.3°F | Resp 18 | Ht 66.0 in | Wt 205.1 lb

## 2015-09-29 DIAGNOSIS — R42 Dizziness and giddiness: Secondary | ICD-10-CM

## 2015-09-29 LAB — COMPREHENSIVE METABOLIC PANEL
ALT: 14 U/L (ref 0–35)
AST: 15 U/L (ref 0–37)
Albumin: 3.9 g/dL (ref 3.5–5.2)
Alkaline Phosphatase: 55 U/L (ref 39–117)
BUN: 8 mg/dL (ref 6–23)
CO2: 30 mEq/L (ref 19–32)
Calcium: 9.2 mg/dL (ref 8.4–10.5)
Chloride: 102 mEq/L (ref 96–112)
Creatinine, Ser: 0.77 mg/dL (ref 0.40–1.20)
GFR: 99.19 mL/min (ref >60.00)
Glucose, Bld: 138 mg/dL — ABNORMAL HIGH (ref 70–99)
Potassium: 3.2 mEq/L — ABNORMAL LOW (ref 3.5–5.1)
Sodium: 137 mEq/L (ref 135–145)
Total Bilirubin: 0.2 mg/dL (ref 0.2–1.2)
Total Protein: 7.4 g/dL (ref 6.0–8.3)

## 2015-09-29 LAB — CBC
HCT: 38.5 % (ref 36.0–46.0)
Hemoglobin: 12.9 g/dL (ref 12.0–15.0)
MCHC: 33.6 g/dL (ref 30.0–36.0)
MCV: 84.2 fl (ref 78.0–100.0)
Platelets: 377 10*3/uL (ref 150.0–400.0)
RBC: 4.57 Mil/uL (ref 3.87–5.11)
RDW: 13.5 % (ref 11.5–15.5)
WBC: 8.7 10*3/uL (ref 4.0–10.5)

## 2015-09-29 MED ORDER — MECLIZINE HCL 12.5 MG PO TABS: 12.5000 mg | ORAL_TABLET | Freq: Three times a day (TID) | ORAL | 0 refills | Status: DC | PRN

## 2015-09-29 NOTE — Progress Notes (Signed)
   Subjective:    Patient ID: Kirsten Campbell, female    DOB: 28-Aug-1960, 55 y.o.   MRN: [id number]  HPI The patient is a 55 YO female coming in for dizziness and nausea. This is going on for about 2 weeks. Overall is not improving but not worsening. She denies falling. Feels like the room is spinning. Denies other symptoms although at the onset she had some ear discomfort. No dysuria, cough, chest pains. No weakness or numbness. No nasal drainage or sinus pressure. Denies medication change or diet change. No injury or preceding illness. Accompanied by nausea but no vomiting.   Review of Systems  Constitutional: Positive for activity change. Negative for appetite change, chills, fatigue and fever.  HENT: Negative.   Respiratory: Negative.   Cardiovascular: Negative.   Gastrointestinal: Positive for nausea. Negative for abdominal distention, abdominal pain, constipation, diarrhea and vomiting.  Musculoskeletal: Positive for gait problem. Negative for arthralgias, back pain and myalgias.  Neurological: Positive for dizziness and light-headedness. Negative for weakness, numbness and headaches.      Objective:   Physical Exam  Constitutional: She is oriented to person, place, and time. She appears well-developed and well-nourished.  HENT:  Head: Normocephalic and atraumatic.  Right Ear: External ear normal.  Left Ear: External ear normal.  Oropharynx with redness and clear drainage.   Eyes: EOM are normal. Pupils are equal, round, and reactive to light.  Neck: Normal range of motion.  Cardiovascular: Normal rate and regular rhythm.   Pulmonary/Chest: Effort normal and breath sounds normal. No respiratory distress. She has no wheezes. She has no rales.  Abdominal: Soft. She exhibits no distension. There is no tenderness.  Neurological: She is alert and oriented to person, place, and time. Coordination abnormal.  Slow to rise and hesitating gait  Skin: Skin is warm and dry.   Vitals:   09/29/15 1332  BP: 136/72  Pulse: 73  Resp: 18  Temp: 98.3 F (36.8 C)  TempSrc: Oral  SpO2: 97%  Weight: 205 lb 1.9 oz (93 kg)  Height: 5\' 6"  (1.676 m)      Assessment & Plan:

## 2015-09-29 NOTE — Patient Instructions (Signed)
We have sent in meclizine which is the dizziness medicine. You can take this 1 pill up to 3 times a day for dizziness.   Taking this medicine when you are not dizzy can make you dizzy so take it only when needed.   We will check the blood work today and call you back with the results.    Dizziness Dizziness is a common problem. It is a feeling of unsteadiness or light-headedness. You may feel like you are about to faint. Dizziness can lead to injury if you stumble or fall. Anyone can become dizzy, but dizziness is more common in older adults. This condition can be caused by a number of things, including medicines, dehydration, or illness. HOME CARE INSTRUCTIONS Taking these steps may help with your condition: Eating and Drinking  Drink enough fluid to keep your urine clear or pale yellow. This helps to keep you from becoming dehydrated. Try to drink more clear fluids, such as water.  Do not drink alcohol.  Limit your caffeine intake if directed by your health care provider.  Limit your salt intake if directed by your health care provider. Activity  Avoid making quick movements.  Rise slowly from chairs and steady yourself until you feel okay.  In the morning, first sit up on the side of the bed. When you feel okay, stand slowly while you hold onto something until you know that your balance is fine.  Move your legs often if you need to stand in one place for a long time. Tighten and relax your muscles in your legs while you are standing.  Do not drive or operate heavy machinery if you feel dizzy.  Avoid bending down if you feel dizzy. Place items in your home so that they are easy for you to reach without leaning over. Lifestyle  Do not use any tobacco products, including cigarettes, chewing tobacco, or electronic cigarettes. If you need help quitting, ask your health care provider.  Try to reduce your stress level, such as with yoga or meditation. Talk with your health care  provider if you need help. General Instructions  Watch your dizziness for any changes.  Take medicines only as directed by your health care provider. Talk with your health care provider if you think that your dizziness is caused by a medicine that you are taking.  Tell a friend or a family member that you are feeling dizzy. If he or she notices any changes in your behavior, have this person call your health care provider.  Keep all follow-up visits as directed by your health care provider. This is important. SEEK MEDICAL CARE IF:  Your dizziness does not go away.  Your dizziness or light-headedness gets worse.  You feel nauseous.  You have reduced hearing.  You have new symptoms.  You are unsteady on your feet or you feel like the room is spinning. SEEK IMMEDIATE MEDICAL CARE IF:  You vomit or have diarrhea and are unable to eat or drink anything.  You have problems talking, walking, swallowing, or using your arms, hands, or legs.  You feel generally weak.  You are not thinking clearly or you have trouble forming sentences. It may take a friend or family member to notice this.  You have chest pain, abdominal pain, shortness of breath, or sweating.  Your vision changes.  You notice any bleeding.  You have a headache.  You have neck pain or a stiff neck.  You have a fever.   This information is  not intended to replace advice given to you by your health care provider. Make sure you discuss any questions you have with your health care provider.   Document Released: 07/11/2000 Document Revised: 06/01/2014 Document Reviewed: 01/11/2014 Elsevier Interactive Patient Education [employer].

## 2015-09-29 NOTE — Progress Notes (Signed)
Pre visit review using our clinic review tool, if applicable. No additional management support is needed unless otherwise documented below in the visit note. 

## 2015-09-29 NOTE — Assessment & Plan Note (Signed)
No new medications or illness. Rx for meclizine and checking CMP and CBC, no signs of infection. Given exercises to help with her likely BPPV. If no improvement she will call us.

## 2015-10-12 ENCOUNTER — Other Ambulatory Visit: Payer: Self-pay | Admitting: Internal Medicine

## 2015-11-02 ENCOUNTER — Encounter: Payer: Self-pay | Admitting: Family Medicine

## 2015-11-13 NOTE — Progress Notes (Deleted)
  Tawana ScaleZach Smith D.O. [facility] [address] [place], [place] [address] Phone: [phone] Subjective:     CC:  Right knee pain follow-up  ONG:[id number]:Subjective  Kirsten Campbell is a 55 y.o. female coming in with complaint of right knee  Regarding patient's right knee pain. Patient has known arthritic changes of the knee. Patient has been wearing them patellar strap and as well as a patellofemoral brace. Patient continued to try conservative therapy. Has tramadol for breaks or pain for another provider. Patient has been doing over-the-counter medications. Patient states   Past Medical History:  Diagnosis Date  . Asthma   . Chicken pox   . Heart murmur   . Hyperlipidemia   . Hypertension    Past Surgical History:  Procedure Laterality Date  . ABDOMINAL HYSTERECTOMY  1995   Social History  Substance Use Topics  . Smoking status: Never Smoker  . Smokeless tobacco: Never Used  . Alcohol use No   Allergies  Allergen Reactions  . Penicillins Hives   Family History  Problem Relation Age of Onset  . Cancer Mother     Breast  . Hypertension Mother   . Hypertension Father   . Hypertension Sister   . Hypertension Brother   . Heart murmur Child        Past medical history, social, surgical and family history all reviewed in electronic medical record.   Review of Systems: No headache, visual changes, nausea, vomiting, diarrhea, constipation, dizziness, abdominal pain, skin rash, fevers, chills, night sweats, weight loss, swollen lymph nodes, body aches, joint swelling, muscle aches, chest pain, shortness of breath, mood changes.   Objective  There were no vitals taken for this visit.  General: No apparent distress alert and oriented x3 mood and affect normal, dressed appropriately.  HEENT: Pupils equal, extraocular movements intact  Respiratory: Patient's speak in full sentences and does not appear short of breath  Cardiovascular: No lower extremity edema, non tender,  no erythema  Skin: Warm dry intact with no signs of infection or rash on extremities or on axial skeleton.  Abdomen: Soft nontender  Neuro: Cranial nerves II through XII are intact, neurovascularly intact in all extremities with 2+ DTRs and 2+ pulses.  Lymph: No lymphadenopathy of posterior or anterior cervical chain or axillae bilaterally.  Gait normal with good balance and coordination.  MSK:  Non tender with full range of motion and good stability and symmetric strength and tone of shoulders, elbows, wrist, hip, and ankles bilaterally.  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 Significant thigh to calf ratio Patient is minimally tender to palpation over the medial joint line otherwise unremarkable ROM full in flexion and extension and lower leg rotation. Instability noted with valgus force Positive Mcmurray's, Apley's, and Thessalonian tests. Non painful patellar compression. Patellar glide with mild crepitus. Patellar and quadriceps tendons unremarkable. Hamstring and quadriceps strength is normal. Contralateral knee unremarkable Worsening symptoms from previous exam.      Impression and Recommendations:     This case required medical decision making of moderate complexity.

## 2015-11-14 ENCOUNTER — Encounter: Payer: Self-pay | Admitting: Family Medicine

## 2015-11-14 ENCOUNTER — Ambulatory Visit: Payer: BC Managed Care – PPO | Admitting: Family Medicine

## 2015-12-20 ENCOUNTER — Encounter: Payer: Self-pay | Admitting: Family Medicine

## 2015-12-27 ENCOUNTER — Ambulatory Visit (INDEPENDENT_AMBULATORY_CARE_PROVIDER_SITE_OTHER)
Admission: RE | Admit: 2015-12-27 | Discharge: 2015-12-27 | Disposition: A | Discharge status: Home / Self Care | Payer: BC Managed Care – PPO | Source: Ambulatory Visit | Attending: Family Medicine | Admitting: Family Medicine

## 2015-12-27 ENCOUNTER — Encounter: Payer: Self-pay | Admitting: Family Medicine

## 2015-12-27 ENCOUNTER — Ambulatory Visit (INDEPENDENT_AMBULATORY_CARE_PROVIDER_SITE_OTHER): Payer: BC Managed Care – PPO | Admitting: Family Medicine

## 2015-12-27 VITALS — BP 122/74 | HR 61 | Ht 66.0 in | Wt 215.0 lb

## 2015-12-27 DIAGNOSIS — M25561 Pain in right knee: Secondary | ICD-10-CM | POA: Diagnosis not present

## 2015-12-27 DIAGNOSIS — M1711 Unilateral primary osteoarthritis, right knee: Secondary | ICD-10-CM

## 2015-12-27 NOTE — Patient Instructions (Signed)
Good to see you  Xray downstairs today  Ice is your friend, Ice 20 minutes 2 times daily. Usually after activity and before bed. Exercises 3 times a week.  pennsaid pinkie amount topically 2 times daily as needed.  Stay active  See me again in 3 weeks.

## 2015-12-27 NOTE — Assessment & Plan Note (Signed)
Injected today and tolerated the procedure well. We discussed icing regimen and home exercises. Patient will continue to stay active. Patient does not do well patient may be a candidate for viscous supplementation. Continue with the brace.

## 2015-12-27 NOTE — Progress Notes (Signed)
Kirsten ScaleZach Jowan Campbell D.O. [facility] [address] [place], [place] [address] Phone: [phone] Subjective:     CC:  Right knee pain follow-up  BJY:[id number]:Subjective  Kirsten Campbell is a [age] y.o. female coming in with complaint of right knee.  Regarding patient's right knee pain. Patient states that it is worsening. At this time. Did have a degenerative meniscal tear. Patient has not been seen for this for quite some time. Patient has been known to have arthritis of the knee previously. States that she was at a wedding recently and was wearing high heels. Since then had significant swelling and was unable to actually move the knee significant. Patient rates the severity of pain is 8 out of 10. Slowly improving over the course last week. States that sometimes it feels unstable.   Past Medical History:  Diagnosis Date  . Asthma   . Chicken pox   . Heart murmur   . Hyperlipidemia   . Hypertension    Past Surgical History:  Procedure Laterality Date  . ABDOMINAL HYSTERECTOMY  1995   Social History  Substance Use Topics  . Smoking status: Never Smoker  . Smokeless tobacco: Never Used  . Alcohol use No   Allergies  Allergen Reactions  . Penicillins Hives   Family History  Problem Relation Age of Onset  . Cancer Mother     Breast  . Hypertension Mother   . Hypertension Father   . Hypertension Sister   . Hypertension Brother   . Heart murmur Child        Past medical history, social, surgical and family history all reviewed in electronic medical record.   Review of Systems: No headache, visual changes, nausea, vomiting, diarrhea, constipation, dizziness, abdominal pain, skin rash, fevers, chills, night sweats, weight loss, swollen lymph nodes, body aches, joint swelling, muscle aches, chest pain, shortness of breath, mood changes.   Objective  Blood pressure 122/74, pulse 61, height 5\' 6"  (1.676 m), weight 215 lb (97.5 kg), SpO2 98 %.  Systems examined below as of  12/27/15 General: NAD A&O x3 mood, affect normal  HEENT: Pupils equal, extraocular movements intact no nystagmus Respiratory: not short of breath at rest or with speaking Cardiovascular: No lower extremity edema, non tender Skin: Warm dry intact with no signs of infection or rash on extremities or on axial skeleton. Abdomen: Soft nontender, no masses Neuro: Cranial nerves  intact, neurovascularly intact in all extremities with 2+ DTRs and 2+ pulses. Lymph: No lymphadenopathy appreciated today  Gait normal with good balance and coordination.  MSK: Non tender with full range of motion and good stability and symmetric strength and tone of shoulders, elbows, wrist hips and ankles bilaterally.   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 Significant thigh to calf ratio, effusionnoted Increasing tenderness noted ROM full in flexion and extension and lower leg rotation. Instability noted with valgus force Positive Mcmurray's, Apley's, and Thessalonian tests. Mild painful patellar compression. Patellar glide with moderate crepitus. Patellar and quadriceps tendons unremarkable. Hamstring and quadriceps strength is normal. Contralateral knee unremarkable Worsening symptoms from previous exam.  After informed written and verbal consent, patient was seated on exam table. Right knee was prepped with alcohol swab and utilizing anterolateral approach, patient's right knee space was injected with 4:1  marcaine 0.5%: Kenalog 40mg /dL. Patient tolerated the procedure well without immediate complications.    Impression and Recommendations:     This case required medical decision making of moderate complexity.

## 2016-01-02 MED FILL — ESTRADIOL 2 MG TABLET: 2 | 90 days supply | Qty: 180 | Fill #2

## 2016-01-13 ENCOUNTER — Encounter: Payer: Self-pay | Admitting: Family Medicine

## 2016-01-17 ENCOUNTER — Ambulatory Visit: Payer: BC Managed Care – PPO | Admitting: Internal Medicine

## 2016-01-26 MED FILL — FLUCONAZOLE 150 MG TABLET: 150 | 6 days supply | Qty: 3 | Fill #1

## 2016-02-06 ENCOUNTER — Other Ambulatory Visit: Payer: Self-pay | Admitting: Internal Medicine

## 2016-02-13 ENCOUNTER — Ambulatory Visit (INDEPENDENT_AMBULATORY_CARE_PROVIDER_SITE_OTHER): Payer: BC Managed Care – PPO | Admitting: Internal Medicine

## 2016-02-13 ENCOUNTER — Other Ambulatory Visit (INDEPENDENT_AMBULATORY_CARE_PROVIDER_SITE_OTHER): Payer: BC Managed Care – PPO

## 2016-02-13 ENCOUNTER — Encounter: Payer: Self-pay | Admitting: Internal Medicine

## 2016-02-13 VITALS — BP 150/90 | HR 68 | Temp 98.1°F | Resp 14 | Ht 70.0 in | Wt 214.0 lb

## 2016-02-13 DIAGNOSIS — R7301 Impaired fasting glucose: Secondary | ICD-10-CM

## 2016-02-13 DIAGNOSIS — E785 Hyperlipidemia, unspecified: Secondary | ICD-10-CM | POA: Diagnosis not present

## 2016-02-13 LAB — COMPREHENSIVE METABOLIC PANEL
ALT: 16 U/L (ref 0–35)
AST: 17 U/L (ref 0–37)
Albumin: 3.7 g/dL (ref 3.5–5.2)
Alkaline Phosphatase: 56 U/L (ref 39–117)
BUN: 9 mg/dL (ref 6–23)
CO2: 29 mEq/L (ref 19–32)
Calcium: 9.4 mg/dL (ref 8.4–10.5)
Chloride: 102 mEq/L (ref 96–112)
Creatinine, Ser: 0.85 mg/dL (ref 0.40–1.20)
GFR: 88.38 mL/min (ref >60.00)
Glucose, Bld: 94 mg/dL (ref 70–99)
Potassium: 3.5 mEq/L (ref 3.5–5.1)
Sodium: 137 mEq/L (ref 135–145)
Total Bilirubin: 0.3 mg/dL (ref 0.2–1.2)
Total Protein: 7.1 g/dL (ref 6.0–8.3)

## 2016-02-13 LAB — LIPID PANEL
Cholesterol: 270 mg/dL — ABNORMAL HIGH (ref 0–200)
HDL: 87.8 mg/dL (ref >39.00)
LDL Cholesterol: 156 mg/dL — ABNORMAL HIGH (ref 0–99)
NonHDL: 182.15
Total CHOL/HDL Ratio: 3
Triglycerides: 131 mg/dL (ref 0.0–149.0)
VLDL: 26.2 mg/dL (ref 0.0–40.0)

## 2016-02-13 LAB — HEMOGLOBIN A1C: Hgb A1c MFr Bld: 6 % (ref 4.6–6.5)

## 2016-02-13 MED ORDER — CYCLOBENZAPRINE HCL 10 MG PO TABS: 10.0000 mg | ORAL_TABLET | Freq: Two times a day (BID) | ORAL | 1 refills | Status: DC

## 2016-02-13 NOTE — Assessment & Plan Note (Signed)
Checking lipid panel as none recently. She is not taking anything right now. LDL goal <130.

## 2016-02-13 NOTE — Patient Instructions (Addendum)
The ingredient called phenylephrine is the cold medicine that will make the blood pressure go up so avoid this. You can always ask the pharmacist if you have any questions about this.   We will check the labs today and call you back about the results.

## 2016-02-13 NOTE — Assessment & Plan Note (Signed)
Checking HgA1c, not diabetes. Not taking any meds for this but working with diet and exercise. Doing okay with that now.

## 2016-02-13 NOTE — Progress Notes (Signed)
   Subjective:    Patient ID: Kirsten GlazierNaomi R Campbell, female    DOB: February 11, 1960, 56 y.o.   MRN: [id number]  HPI The patient is a 56 YO female coming in for follow up on her sugars (has been working on weight neutral and cutting back some of the carbs, she is exercising still at work and walking 10K steps per day) and her cholesterol (taking nothing right now, needs follow up on this as no lipid panel recently, has made some diet changes recently for the sugars). No new concerns.   Review of Systems  Constitutional: Positive for activity change and appetite change. Negative for fatigue, fever and unexpected weight change.  Respiratory: Negative.   Cardiovascular: Negative.   Gastrointestinal: Negative.   Musculoskeletal: Negative.   Neurological: Negative.       Objective:   Physical Exam  Constitutional: She is oriented to person, place, and time. She appears well-developed and well-nourished.  HENT:  Head: Normocephalic and atraumatic.  Eyes: EOM are normal.  Neck: Normal range of motion.  Cardiovascular: Normal rate and regular rhythm.   Pulmonary/Chest: Effort normal. No respiratory distress. She has no wheezes. She has no rales.  Abdominal: Soft.  Musculoskeletal: She exhibits no edema.  Neurological: She is alert and oriented to person, place, and time.  Skin: Skin is warm and dry.   Vitals:   02/13/16 0830  BP: (!) 150/90  Pulse: 68  Resp: 14  Temp: 98.1 F (36.7 C)  TempSrc: Oral  SpO2: 97%  Weight: 214 lb (97.1 kg)  Height: 5\' 10"  (1.778 m)      Assessment & Plan:

## 2016-02-13 NOTE — Progress Notes (Signed)
Pre visit review using our clinic review tool, if applicable. No additional management support is needed unless otherwise documented below in the visit note. 

## 2016-04-02 MED FILL — ESTRADIOL 2 MG TABLET: 2 | 90 days supply | Qty: 180 | Fill #3

## 2016-04-08 ENCOUNTER — Other Ambulatory Visit: Payer: Self-pay | Admitting: Internal Medicine

## 2016-04-18 ENCOUNTER — Other Ambulatory Visit: Payer: Self-pay | Admitting: Internal Medicine

## 2016-04-19 ENCOUNTER — Encounter: Payer: Self-pay | Admitting: Internal Medicine

## 2016-05-01 ENCOUNTER — Telehealth: Payer: Self-pay | Admitting: *Deleted

## 2016-05-01 NOTE — Telephone Encounter (Signed)
Rec'd fqax from [facility] pt received the Shingrix injection there on 04/30/16. Abstracted chart w/immunization.....Raechel Chute

## 2016-05-06 ENCOUNTER — Other Ambulatory Visit: Payer: Self-pay | Admitting: Internal Medicine

## 2016-07-02 MED FILL — ESTRADIOL 2 MG TABLET: 2 | 90 days supply | Qty: 180 | Fill #0

## 2016-08-06 ENCOUNTER — Other Ambulatory Visit: Payer: Self-pay | Admitting: Internal Medicine

## 2016-08-12 ENCOUNTER — Other Ambulatory Visit: Payer: Self-pay | Admitting: Family Medicine

## 2016-08-13 ENCOUNTER — Other Ambulatory Visit: Payer: Self-pay

## 2016-08-13 MED ORDER — GABAPENTIN 400 MG PO CAPS: ORAL_CAPSULE | ORAL | 3 refills | Status: DC

## 2016-08-17 ENCOUNTER — Telehealth: Payer: Self-pay | Admitting: Family Medicine

## 2016-08-17 ENCOUNTER — Other Ambulatory Visit: Payer: Self-pay | Admitting: Obstetrics & Gynecology

## 2016-08-17 DIAGNOSIS — Z1231 Encounter for screening mammogram for malignant neoplasm of breast: Secondary | ICD-10-CM

## 2016-08-17 NOTE — Telephone Encounter (Signed)
Pt called and would like a refill of traMADol (ULTRAM) 50 MG tablet  She would like to know if she needs an appointment for this.  She would like a response via MyChart. Please advise.

## 2016-08-20 ENCOUNTER — Encounter: Payer: Self-pay | Admitting: *Deleted

## 2016-08-20 MED ORDER — TRAMADOL HCL 50 MG PO TABS: 50.0000 mg | ORAL_TABLET | Freq: Three times a day (TID) | ORAL | 0 refills | Status: DC | PRN

## 2016-08-20 NOTE — Telephone Encounter (Signed)
Pt made aware through mychart as requested.

## 2016-08-20 NOTE — Telephone Encounter (Signed)
Refilled and faxed but need to be seen in the next month

## 2016-09-10 NOTE — Progress Notes (Signed)
Tawana ScaleZach Jontez Redfield D.O. [facility] [address] [place], [place] [address] Phone: [phone] Subjective:    I'm seeing this patient by the request  of:    CC: Left leg pain  LKG:[id number]:Subjective  Kirsten Campbell is a 56 y.o. female coming in with complaint of left leg pain. Past medical history significant for lumbar radiculopathy. Patient had been doing very well since patient's last epidural in November. Patient has noticed that the pain is starting to worsen the leg. Some cramping at night. Has done very well with the tramadol in the past. For refill. Continues to gabapentin at night which is helpful. Patient states sometimes this can be very discomfort. Does not stop her from activities.       Past Medical History:  Diagnosis Date  . Asthma   . Chicken pox   . Heart murmur   . Hyperlipidemia   . Hypertension    Past Surgical History:  Procedure Laterality Date  . ABDOMINAL HYSTERECTOMY  1995   Social History   Social History  . Marital status: Married    Spouse name: N/A  . Number of children: N/A  . Years of education: N/A   Social History Main Topics  . Smoking status: Never Smoker  . Smokeless tobacco: Never Used  . Alcohol use No  . Drug use: No  . Sexual activity: Not Asked   Other Topics Concern  . None   Social History Narrative  . None   Allergies  Allergen Reactions  . Penicillins Hives   Family History  Problem Relation Age of Onset  . Cancer Mother        Breast  . Hypertension Mother   . Hypertension Father   . Hypertension Sister   . Hypertension Brother   . Heart murmur Child      Past medical history, social, surgical and family history all reviewed in electronic medical record.  No pertanent information unless stated regarding to the chief complaint.   Review of Systems:Review of systems updated and as accurate as of 09/11/16  No headache, visual changes, nausea, vomiting, diarrhea, constipation, dizziness, abdominal pain,  skin rash, fevers, chills, night sweats, weight loss, swollen lymph nodes, body aches, joint swelling,  chest pain, shortness of breath, mood changes. Positive muscle aches  Objective  Blood pressure 132/82, pulse 68, height 5\' 10"  (1.778 m), weight 214 lb (97.1 kg), SpO2 97 %. Systems examined below as of 09/11/16   General: No apparent distress alert and oriented x3 mood and affect normal, dressed appropriately.  HEENT: Pupils equal, extraocular movements intact  Respiratory: Patient's speak in full sentences and does not appear short of breath  Cardiovascular: No lower extremity edema, non tender, no erythema  Skin: Warm dry intact with no signs of infection or rash on extremities or on axial skeleton.  Abdomen: Soft nontender  Neuro: Cranial nerves II through XII are intact, neurovascularly intact in all extremities with 2+ DTRs and 2+ pulses.  Lymph: No lymphadenopathy of posterior or anterior cervical chain or axillae bilaterally.  Gait normal with good balance and coordination.  MSK:  Non tender with full range of motion and good stability and symmetric strength and tone of shoulders, elbows, wrist, hip, knee and ankles bilaterally.  Back Exam:  Inspection: Mild loss of lordosis Motion: Flexion 45 deg, Extension 25 deg, Side Bending to 35 deg bilaterally,  Rotation to 35 deg bilaterally  SLR laying: Negative  XSLR laying: Negative  Palpable tenderness: Diffuse tenderness to  palpation in the paraspinal musculature.Marland Kitchen FABER: Tightness bilaterally left greater than right. Sensory change: Gross sensation intact to all lumbar and sacral dermatomes.  Reflexes: 2+ at both patellar tendons, 2+ at achilles tendons, Babinski's downgoing.  Strength at foot  Plantar-flexion: 5/5 Dorsi-flexion: 5/5 Eversion: 5/5 Inversion: 5/5  Leg strength  Quad: 5/5 Hamstring: 5/5 Hip flexor: 5/5 Hip abductors: 5/5  Gait unremarkable.     Impression and Recommendations:     This case required medical  decision making of moderate complexity.      Note: This dictation was prepared with Dragon dictation along with smaller phrase technology. Any transcriptional errors that result from this process are unintentional.

## 2016-09-11 ENCOUNTER — Ambulatory Visit (INDEPENDENT_AMBULATORY_CARE_PROVIDER_SITE_OTHER): Payer: BC Managed Care – PPO | Admitting: Family Medicine

## 2016-09-11 ENCOUNTER — Encounter: Payer: Self-pay | Admitting: Family Medicine

## 2016-09-11 DIAGNOSIS — M5417 Radiculopathy, lumbosacral region: Secondary | ICD-10-CM | POA: Diagnosis not present

## 2016-09-11 MED ORDER — VITAMIN D (ERGOCALCIFEROL) 1.25 MG (50000 UNIT) PO CAPS: 50000.0000 [IU] | ORAL_CAPSULE | ORAL | 0 refills | Status: DC

## 2016-09-11 MED ORDER — TRAMADOL HCL 50 MG PO TABS: 50.0000 mg | ORAL_TABLET | Freq: Three times a day (TID) | ORAL | 3 refills | Status: DC | PRN

## 2016-09-11 NOTE — Assessment & Plan Note (Signed)
I do believe the patient's leg pain is likely secondary to the radicular symptoms again. Did not increase gabapentin with patient having difficulty with staying awake in the morning. Patient's given a refill of tramadol. We discussed continuing the vitamin supplementation. We discussed the possibility of laboratory workup which patient declined. Do believe that some of this is worsening. We also discussed the possibility of another epidural which patient declined. Patient will continue to be active otherwise. Follow-up with me again in 4-8 weeks.

## 2016-09-11 NOTE — Patient Instructions (Addendum)
Good to see you  Refilled the tramadol and can take up to 3 times a day but limit it Tylenol with it as well Once weekly vitamin D for 12 weeks.  If worsening pain we will consider injection  See me again when you need me or at least 1 time a year.

## 2016-09-13 ENCOUNTER — Ambulatory Visit
Admission: RE | Admit: 2016-09-13 | Discharge: 2016-09-13 | Disposition: A | Discharge status: Home / Self Care | Payer: BC Managed Care – PPO | Source: Ambulatory Visit | Attending: Obstetrics & Gynecology | Admitting: Obstetrics & Gynecology

## 2016-09-13 DIAGNOSIS — Z1231 Encounter for screening mammogram for malignant neoplasm of breast: Secondary | ICD-10-CM

## 2016-09-19 IMAGING — MR MR LUMBAR SPINE W/O CM
5 series · 41 of 48 positions shown · non-contrast
Comparison: Lumbar radiographs 06/14/2014

CLINICAL DATA: 54-year-old female with lumbar back pain radiating
to the left hip and lower extremity for 4 weeks. No known injury.
Lumbar radiculopathy. Subsequent encounter.

EXAM:
MRI LUMBAR SPINE WITHOUT CONTRAST
TECHNIQUE: Multiplanar, multisequence MR imaging of the lumbar spine was
performed. No intravenous contrast was administered.

[Series 2: tirm sag · sagittal · 4.0mm · 0.55mm/px · 6 of 13 slices shown]
[im 1/13]
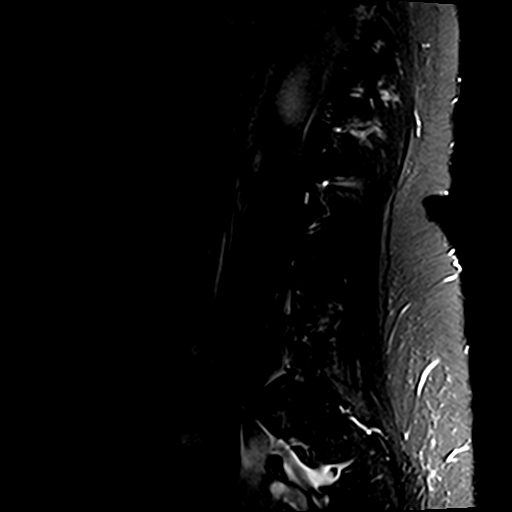
[im 3/13]
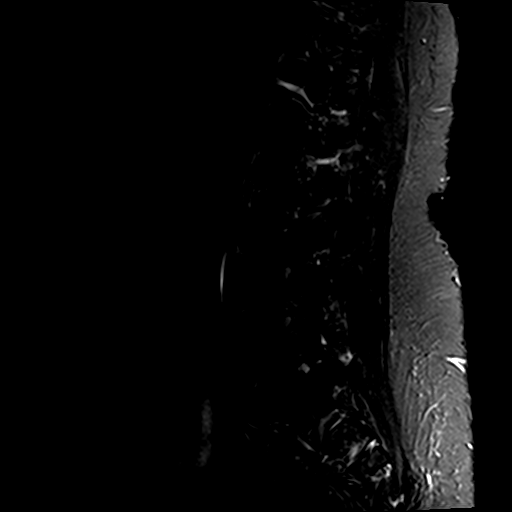
[im 5/13]
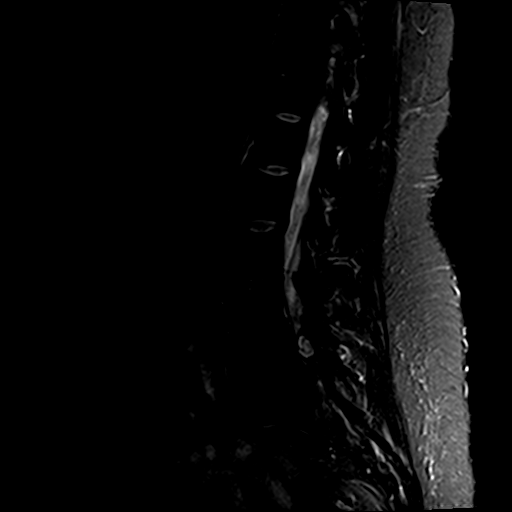
[im 8/13]
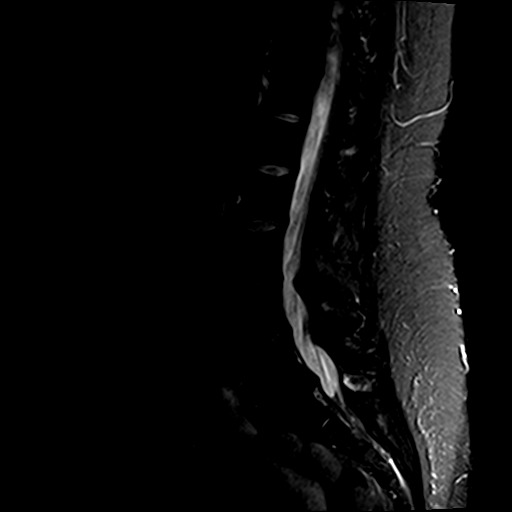
[im 10/13]
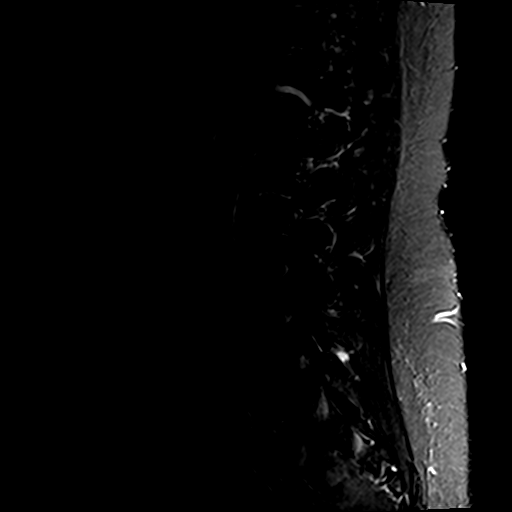
[im 13/13]
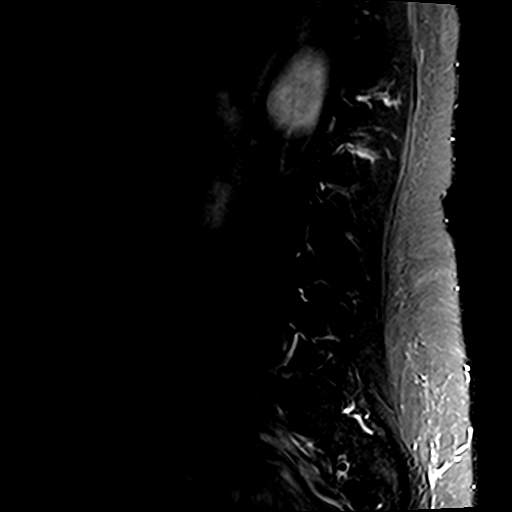

[Series 3: T2 · sagittal · 4.0mm · 0.88mm/px · 6 of 13 slices shown (1 of 2)]
[im 1/13]
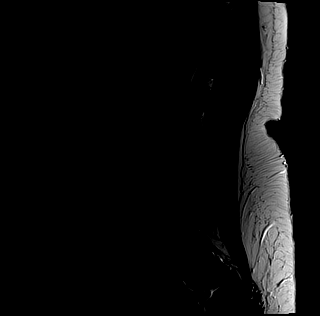
[im 3/13]
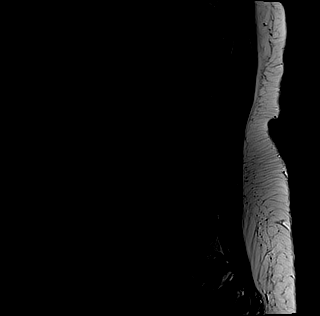
[im 5/13]
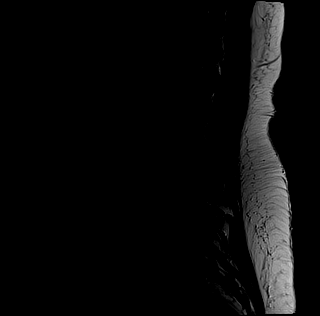
[im 8/13]
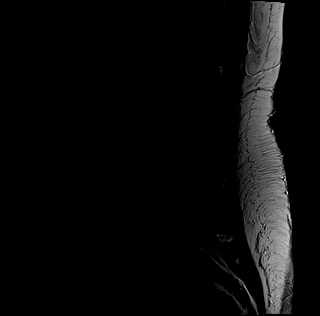
[im 10/13]
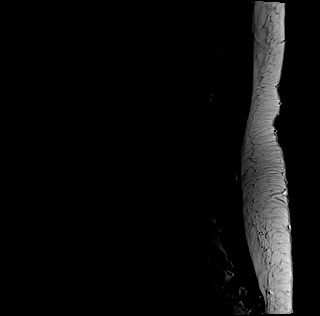
[im 13/13]
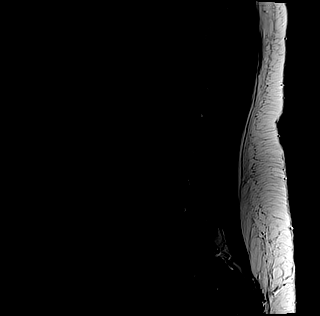

[Series 4: T1 · sagittal · 4.0mm · 0.88mm/px · 6 of 13 slices shown (1 of 2)]
[im 1/13]
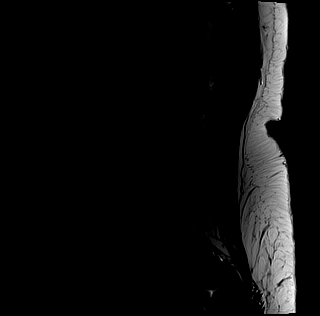
[im 3/13]
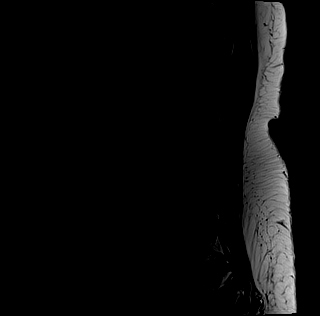
[im 5/13]
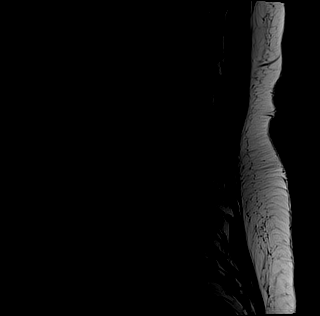
[im 8/13]
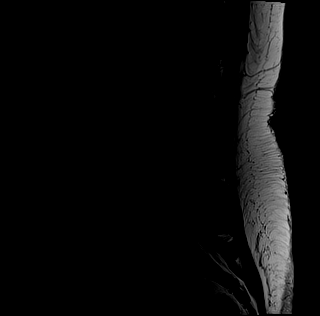
[im 10/13]
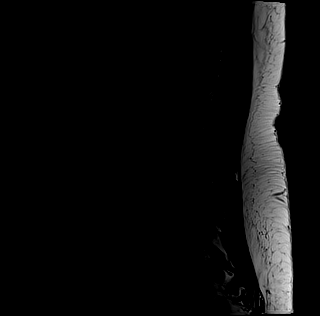
[im 13/13]
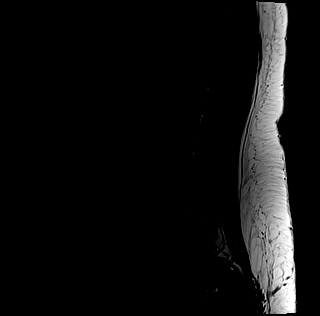

[Series 5: T1 · axial · 4.0mm · 0.70mm/px · z∈[-114,+66]mm · 9 of 33 slices shown (2 of 2)]
[im 1/33]
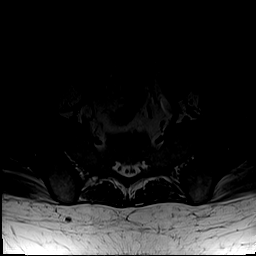
[im 5/33]
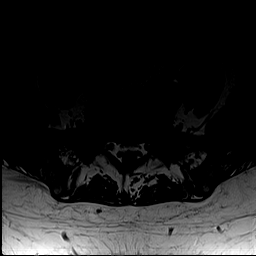
[im 10/33]
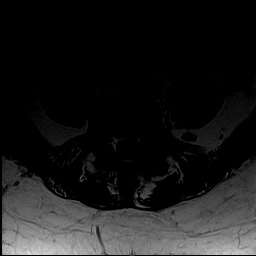
[im 14/33]
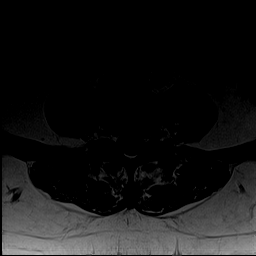
[im 17/33]
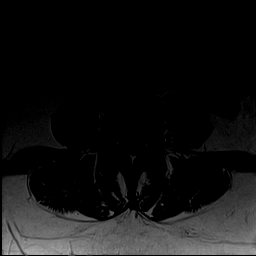
[im 19/33]
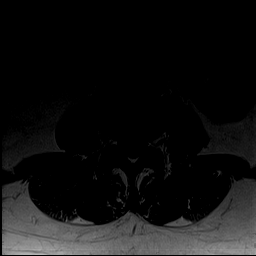
[im 23/33]
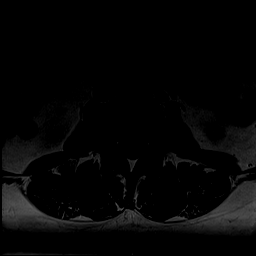
[im 28/33]
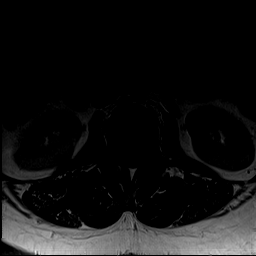
[im 33/33]
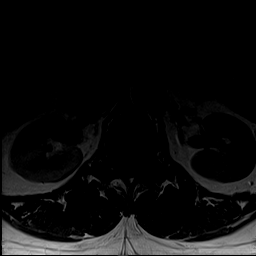

[Series 6: T2 · axial · 4.0mm · 0.70mm/px · z∈[-114,+66]mm · 14 of 33 slices shown (2 of 2)]
[im 1/33]
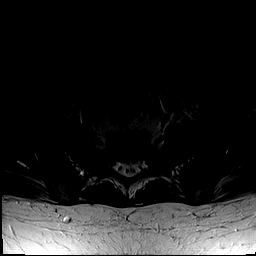
[im 3/33]
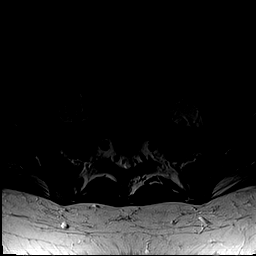
[im 5/33]
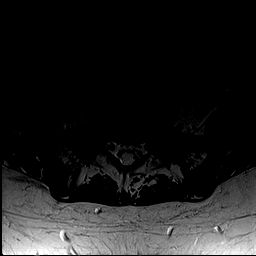
[im 7/33]
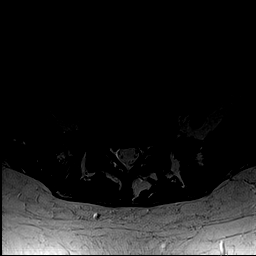
[im 10/33]
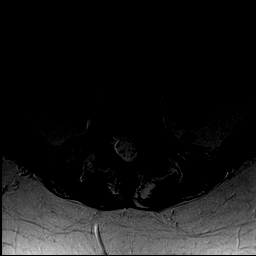
[im 12/33]
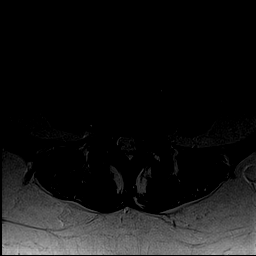
[im 14/33]
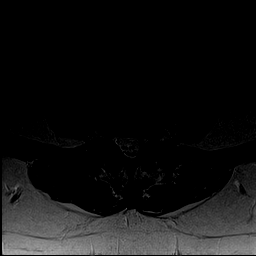
[im 17/33]
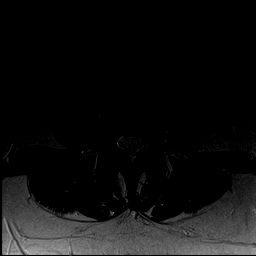
[im 19/33]
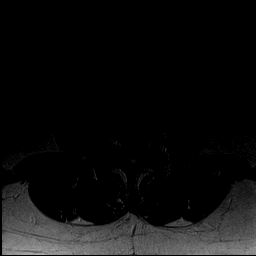
[im 21/33]
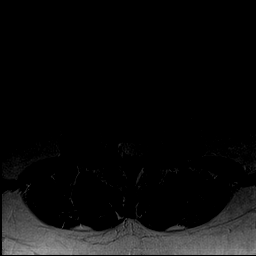
[im 23/33]
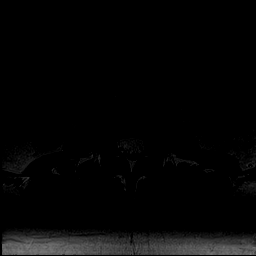
[im 26/33]
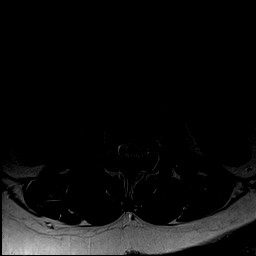
[im 28/33]
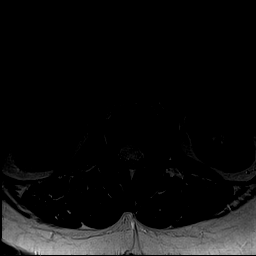
[im 33/33]
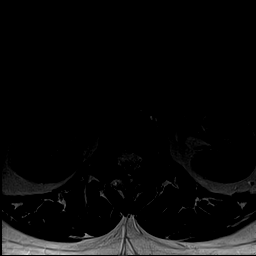

[41 of 48 positions shown; findings below may reference images not displayed]

FINDINGS: Normal lumbar segmentation demonstrated on the comparison. Stable
vertebral height and alignment. Mild retrolisthesis of L5 on S1.
Trace retrolisthesis at L2-L3. Chronic degenerative endplate changes
at L5-S1. No marrow edema or evidence of acute osseous abnormality.

Visualized lower thoracic spinal cord is normal with conus medularis
at L1.

Visualized abdominal viscera and paraspinal soft tissues are within
normal limits.

T10-T11: disc desiccation and circumferential disc bulge. Moderate
facet hypertrophy. Mild T10 foraminal stenosis.

T11-T12:  Circumferential disc bulge.  Mild T11 foraminal stenosis.

T12-L1:  Negative.

L1-L2:  Negative.

L2-L3: Mild to moderate facet hypertrophy. Trace facet joint fluid.
No stenosis.

L3-L4: Mild disc desiccation and disc bulge. Mild to moderate facet
and moderate ligament flavum hypertrophy no significant stenosis.

L4-L5: Mild disc desiccation and circumferential disc bulge. Severe
facet hypertrophy greater on the left. That joint is capacious and
demonstrates fluid and/or synovial hypertrophy. There is a large
degenerative synovial cyst arising anteriorly from the facet
encompassing 13 x 10 x 13 mm. See series 2, image 9 and series 6,
image 23. This severely affects the left lateral recess at and just
below the disc space level along the course of the descending left
L5 nerve roots.

No significant spinal stenosis. There is superimposed mild to
moderate multifactorial left L4 foraminal stenosis primarily due to
facet spurring.

L5-S1: Severe disc space loss and circumferential disc osteophyte
complex. Right paracentral broad-based disc protrusion is
superimposed abutting the descending right S1 nerve roots in the
lateral recess (series 6, image 27). No spinal stenosis. Endplate
spurring contributes to mild bilateral L5 foraminal stenosis.
IMPRESSION: 1. Symptomatic abnormality favored to be a 13 mm synovial cyst at
L4-L5 projecting into the left lateral recess and severely affecting
the course of the left L5 nerve roots. Severe facet degeneration at
that level greater on the left. Superimposed mild to moderate left
L4 foraminal stenosis.
2. Advanced chronic disc and endplate degeneration at L5-S1. Mild
right lateral recess and foraminal stenosis.

## 2016-10-03 MED FILL — ESTRADIOL 2 MG TABLET: 2 | 90 days supply | Qty: 180 | Fill #0

## 2016-12-27 MED FILL — ESTRADIOL 2 MG TABLET: 2 | 90 days supply | Qty: 180 | Fill #1

## 2017-01-02 ENCOUNTER — Encounter: Payer: Self-pay | Admitting: Internal Medicine

## 2017-01-03 ENCOUNTER — Telehealth: Payer: Self-pay | Admitting: Internal Medicine

## 2017-01-03 ENCOUNTER — Other Ambulatory Visit: Payer: Self-pay

## 2017-01-03 MED ORDER — AMLODIPINE BESYLATE 10 MG PO TABS: 10.0000 mg | ORAL_TABLET | Freq: Every day | ORAL | 0 refills | Status: DC

## 2017-01-03 MED ORDER — AMLODIPINE BESYLATE 10 MG PO TABS
10.0000 mg | ORAL_TABLET | Freq: Every day | ORAL | 2 refills | Status: DC
Start: 2017-01-03 — End: 2017-01-03

## 2017-01-03 NOTE — Telephone Encounter (Signed)
Pt requesting 90 day refill of Norvasc 10 mg; 30 day refill granted per protocol; does pt need an office visit before any more refills can be granted?  Pt can be reached at [phone] (home phone)

## 2017-01-03 NOTE — Telephone Encounter (Signed)
Copied from CRM [phone]. Topic: Quick Communication - Rx Refill/Question >> Jan 03, 2017  9:31 AM Leafy Roobinson, Norma J wrote: Has the patient contacted their pharmacy? yes  (Agent: If no, request that the patient contact the pharmacy for the refill.) pt needs refill on amlodipine 10 mg #90   Preferred Pharmacy (with phone number or street name): [facility] [address] [phone]  Agent: Please be advised that RX refills may take up to 3 business days. We ask that you follow-up with your pharmacy.

## 2017-02-19 ENCOUNTER — Ambulatory Visit: Payer: BC Managed Care – PPO | Admitting: Internal Medicine

## 2017-02-19 ENCOUNTER — Other Ambulatory Visit (INDEPENDENT_AMBULATORY_CARE_PROVIDER_SITE_OTHER): Payer: BC Managed Care – PPO

## 2017-02-19 ENCOUNTER — Encounter: Payer: Self-pay | Admitting: Internal Medicine

## 2017-02-19 VITALS — BP 122/80 | HR 71 | Temp 97.8°F | Ht 70.0 in | Wt 217.0 lb

## 2017-02-19 DIAGNOSIS — E6609 Other obesity due to excess calories: Secondary | ICD-10-CM

## 2017-02-19 DIAGNOSIS — R7301 Impaired fasting glucose: Secondary | ICD-10-CM

## 2017-02-19 DIAGNOSIS — I10 Essential (primary) hypertension: Secondary | ICD-10-CM

## 2017-02-19 DIAGNOSIS — Z6830 Body mass index (BMI) 30.0-30.9, adult: Secondary | ICD-10-CM | POA: Diagnosis not present

## 2017-02-19 DIAGNOSIS — Z0000 Encounter for general adult medical examination without abnormal findings: Secondary | ICD-10-CM | POA: Diagnosis not present

## 2017-02-19 LAB — COMPREHENSIVE METABOLIC PANEL
ALT: 18 U/L (ref 0–35)
AST: 18 U/L (ref 0–37)
Albumin: 3.8 g/dL (ref 3.5–5.2)
Alkaline Phosphatase: 58 U/L (ref 39–117)
BUN: 8 mg/dL (ref 6–23)
CO2: 29 mEq/L (ref 19–32)
Calcium: 9 mg/dL (ref 8.4–10.5)
Chloride: 102 mEq/L (ref 96–112)
Creatinine, Ser: 0.75 mg/dL (ref 0.40–1.20)
GFR: 101.75 mL/min (ref >60.00)
Glucose, Bld: 94 mg/dL (ref 70–99)
Potassium: 3.6 mEq/L (ref 3.5–5.1)
Sodium: 138 mEq/L (ref 135–145)
Total Bilirubin: 0.2 mg/dL (ref 0.2–1.2)
Total Protein: 7 g/dL (ref 6.0–8.3)

## 2017-02-19 LAB — CBC
HCT: 39.5 % (ref 36.0–46.0)
Hemoglobin: 12.9 g/dL (ref 12.0–15.0)
MCHC: 32.6 g/dL (ref 30.0–36.0)
MCV: 87.1 fl (ref 78.0–100.0)
Platelets: 353 10*3/uL (ref 150.0–400.0)
RBC: 4.54 Mil/uL (ref 3.87–5.11)
RDW: 13.2 % (ref 11.5–15.5)
WBC: 7.7 10*3/uL (ref 4.0–10.5)

## 2017-02-19 LAB — LIPID PANEL
Cholesterol: 230 mg/dL — ABNORMAL HIGH (ref 0–200)
HDL: 76 mg/dL (ref >39.00)
LDL Cholesterol: 116 mg/dL — ABNORMAL HIGH (ref 0–99)
NonHDL: 153.5
Total CHOL/HDL Ratio: 3
Triglycerides: 190 mg/dL — ABNORMAL HIGH (ref 0.0–149.0)
VLDL: 38 mg/dL (ref 0.0–40.0)

## 2017-02-19 LAB — HEMOGLOBIN A1C: Hgb A1c MFr Bld: 6.2 % (ref 4.6–6.5)

## 2017-02-19 MED ORDER — CYCLOBENZAPRINE HCL 10 MG PO TABS: 10.0000 mg | ORAL_TABLET | Freq: Two times a day (BID) | ORAL | 6 refills | Status: DC

## 2017-02-19 MED ORDER — LOSARTAN POTASSIUM-HCTZ 100-12.5 MG PO TABS: 1.0000 | ORAL_TABLET | Freq: Every day | ORAL | 3 refills | Status: DC

## 2017-02-19 MED ORDER — AMLODIPINE BESYLATE 10 MG PO TABS: 10.0000 mg | ORAL_TABLET | Freq: Every day | ORAL | 3 refills | Status: DC

## 2017-02-19 NOTE — Progress Notes (Signed)
   Subjective:    Patient ID: Kirsten Campbell, female    DOB: 1960-11-19, 57 y.o.   MRN: [id number]  HPI The patient is a 57 YO female coming in for physical. No new concerns.  PMH, [facility], social history reviewed and updated.   Review of Systems  Constitutional: Negative.   HENT: Negative.   Eyes: Negative.   Respiratory: Negative for cough, chest tightness and shortness of breath.   Cardiovascular: Negative for chest pain, palpitations and leg swelling.  Gastrointestinal: Negative for abdominal distention, abdominal pain, constipation, diarrhea, nausea and vomiting.  Musculoskeletal: Positive for arthralgias.  Skin: Negative.   Neurological: Negative.   Psychiatric/Behavioral: Negative.       Objective:   Physical Exam  Constitutional: She is oriented to person, place, and time. She appears well-developed and well-nourished.  HENT:  Head: Normocephalic and atraumatic.  Eyes: EOM are normal.  Neck: Normal range of motion.  Cardiovascular: Normal rate and regular rhythm.  Pulmonary/Chest: Effort normal and breath sounds normal. No respiratory distress. She has no wheezes. She has no rales.  Abdominal: Soft. Bowel sounds are normal. She exhibits no distension. There is no tenderness. There is no rebound.  Musculoskeletal: She exhibits no edema.  Neurological: She is alert and oriented to person, place, and time. Coordination normal.  Skin: Skin is warm and dry.  Psychiatric: She has a normal mood and affect.   Vitals:   02/19/17 0842 02/19/17 0906  BP: (!) 150/88 122/80  Pulse: 71   Temp: 97.8 F (36.6 C)   TempSrc: Oral   SpO2: 98%   Weight: 217 lb (98.4 kg)   Height: 5\' 10"  (1.778 m)       Assessment & Plan:

## 2017-02-19 NOTE — Patient Instructions (Signed)

## 2017-02-19 NOTE — Assessment & Plan Note (Signed)
Weight is stable and she would like to work on weight loss. Counseled about diet and exercise.

## 2017-02-19 NOTE — Assessment & Plan Note (Signed)
BP at goal on her verapamil and amlodipine and losartan/hctz. She is on two calcium channel blockers and has been without side effect for some time and will continue. Checking CMP and adjust as needed.

## 2017-02-19 NOTE — Assessment & Plan Note (Signed)
Checking HgA1c and adjust as needed.  

## 2017-02-19 NOTE — Assessment & Plan Note (Signed)
Mammogram and colonoscopy up to date. Flu and tetanus shot up to date. Got shingrix at pharmacy this year both doses. Pap smear not needed due to hysterectomy for benign cause. Declines HIV screening. Counseled about sun safety and mole surveillance. Given screening recommendations.

## 2017-02-20 LAB — HEPATITIS C ANTIBODY
Hepatitis C Ab: NONREACTIVE
SIGNAL TO CUT-OFF: 0.03 (ref <1.00)

## 2017-03-28 ENCOUNTER — Other Ambulatory Visit: Payer: Self-pay

## 2017-03-28 MED ORDER — VERAPAMIL HCL ER 180 MG PO TBCR: 180.0000 mg | EXTENDED_RELEASE_TABLET | Freq: Every day | ORAL | 3 refills | Status: DC

## 2017-04-01 MED FILL — ESTRADIOL 2 MG TABLET: 2 | 90 days supply | Qty: 180 | Fill #2

## 2017-04-13 ENCOUNTER — Other Ambulatory Visit: Payer: Self-pay | Admitting: Internal Medicine

## 2017-07-04 MED FILL — ESTRADIOL 2 MG TABLET: 2 | 90 days supply | Qty: 180 | Fill #0

## 2017-08-14 ENCOUNTER — Encounter: Payer: Self-pay | Admitting: Family Medicine

## 2017-08-14 ENCOUNTER — Ambulatory Visit: Payer: BC Managed Care – PPO | Admitting: Family Medicine

## 2017-08-14 DIAGNOSIS — M5417 Radiculopathy, lumbosacral region: Secondary | ICD-10-CM

## 2017-08-14 DIAGNOSIS — R269 Unspecified abnormalities of gait and mobility: Secondary | ICD-10-CM | POA: Insufficient documentation

## 2017-08-14 MED ORDER — TRAMADOL HCL 50 MG PO TABS: 50.0000 mg | ORAL_TABLET | Freq: Three times a day (TID) | ORAL | 3 refills | Status: DC | PRN

## 2017-08-14 MED ORDER — GABAPENTIN 400 MG PO CAPS: ORAL_CAPSULE | ORAL | 3 refills | Status: DC

## 2017-08-14 NOTE — Assessment & Plan Note (Signed)
No true radicular symptoms at this time.  Continues gabapentin 400 mg, refilled today.  No change in the amount.  Discussed with patient about the possibility of formal physical therapy or repeating imaging which patient has declined.  Discussed icing regimen, topical anti-inflammatories, follow-up again in 4 to 6 weeks

## 2017-08-14 NOTE — Patient Instructions (Addendum)
Good to see you  Ice 20 minutes 2 times daily. Usually after activity and before bed. Refilled the gabapentin and the tramadol for you  Exercises 3 times a week.  Love the shoes and will call you when we get the orthotics in  See me when you need me or if the hip does not improve.

## 2017-08-14 NOTE — Progress Notes (Signed)
[name] [address] [place], [place] [address] Phone: [phone] Subjective:      CC: Back pain and foot pain follow-up  YQM:[id number]  Kirsten Campbell is a 57 y.o. female coming in with complaint of back pain. Patient is having left leg pain since we last saw her. Pain is intermittent. She is here for a medication refill since we have not seen her in one year.  Patient did have significant back pain and was found to have a cyst that did not need surgical intervention in the lumbar spine.  Has been doing very well.  Intermittent pain still in the back that does radiate down the left leg but nothing as severe.  Seems to come and go.  Has been doing the exercises intermittently.  Found also have significant breakdown of the transverse arch as well as the longitudinal arch causing some abnormality of gait.  Has been responding very well though to the custom orthotics made for her greater than 2 years ago.  Feels like they have broken down.  Feels like she may need another pair.  When she was using the new she felt like everything was feeling better and she was more active.      Past Medical History:  Diagnosis Date  . Asthma   . Chicken pox   . Heart murmur   . Hyperlipidemia   . Hypertension    Past Surgical History:  Procedure Laterality Date  . ABDOMINAL HYSTERECTOMY  1995   Social History   Socioeconomic History  . Marital status: Married    Spouse name: Not on file  . Number of children: Not on file  . Years of education: Not on file  . Highest education level: Not on file  Occupational History  . Not on file  Social Needs  . Financial resource strain: Not on file  . Food insecurity:    Worry: Not on file    Inability: Not on file  . Transportation needs:    Medical: Not on file    Non-medical: Not on file  Tobacco Use  . Smoking status: Never Smoker  . Smokeless tobacco: Never Used  Substance and Sexual Activity  . Alcohol  use: No  . Drug use: No  . Sexual activity: Not on file  Lifestyle  . Physical activity:    Days per week: Not on file    Minutes per session: Not on file  . Stress: Not on file  Relationships  . Social connections:    Talks on phone: Not on file    Gets together: Not on file    Attends religious service: Not on file    Active member of club or organization: Not on file    Attends meetings of clubs or organizations: Not on file    Relationship status: Not on file  Other Topics Concern  . Not on file  Social History Narrative  . Not on file   Allergies  Allergen Reactions  . Penicillins Hives   Family History  Problem Relation Age of Onset  . Cancer Mother        Breast  . Hypertension Mother   . Breast cancer Mother   . Hypertension Father   . Hypertension Sister   . Breast cancer Sister   . Hypertension Brother   . Heart murmur Child      Past medical history, social, surgical and family history all reviewed in electronic medical record.  No  pertanent information unless stated regarding to the chief complaint.   Review of Systems:Review of systems updated and as accurate as of 08/14/17  No headache, visual changes, nausea, vomiting, diarrhea, constipation, dizziness, abdominal pain, skin rash, fevers, chills, night sweats, weight loss, swollen lymph nodes, body aches, joint swelling, muscle aches, chest pain, shortness of breath, mood changes.   Objective  Blood pressure 126/76, pulse 87, height 5\' 10"  (1.778 m), weight 217 lb (98.4 kg), SpO2 97 %. Systems examined below as of 08/14/17   General: No apparent distress alert and oriented x3 mood and affect normal, dressed appropriately.  HEENT: Pupils equal, extraocular movements intact  Respiratory: Patient's speak in full sentences and does not appear short of breath  Cardiovascular: No lower extremity edema, non tender, no erythema  Skin: Warm dry intact with no signs of infection or rash on extremities or on  axial skeleton.  Abdomen: Soft nontender  Neuro: Cranial nerves II through XII are intact, neurovascularly intact in all extremities with 2+ DTRs and 2+ pulses.  Lymph: No lymphadenopathy of posterior or anterior cervical chain or axillae bilaterally.  Gait normal with good balance and coordination.  MSK:  Non tender with full range of motion and good stability and symmetric strength and tone of shoulders, elbows, wrist, hip, knee and ankles bilaterally.   Back Exam:  Inspection: Mild loss of lordosis Motion: Flexion 45 deg, Extension 20 deg, Side Bending to 30 deg bilaterally,  Rotation to 45 deg bilaterally  SLR laying: Negative  XSLR laying: Negative  Palpable tenderness: Tender to palpation the paraspinal musculature lumbar spine left greater than right FABER: Positive left. Sensory change: Gross sensation intact to all lumbar and sacral dermatomes.  Reflexes: 2+ at both patellar tendons, 2+ at achilles tendons, Babinski's downgoing.  Strength at foot  Plantar-flexion: 5/5 Dorsi-flexion: 5/5 Eversion: 5/5 Inversion: 5/5  Leg strength  Quad: 5/5 Hamstring: 5/5 Hip flexor: 5/5 Hip abductors: 5/5  Gait unremarkable.  Foot exam shows some bunion and bunionette formation bilaterally with breakdown of the transverse arch.  Mild overpronation of the hindfoot.  Otherwise fairly unremarkable with minimal tenderness on exam.    Impression and Recommendations:     This case required medical decision making of moderate complexity.      Note: This dictation was prepared with Dragon dictation along with smaller phrase technology. Any transcriptional errors that result from this process are unintentional.

## 2017-08-14 NOTE — Assessment & Plan Note (Signed)
Custom orthotics ordered and will have patient fitted in the near future.

## 2017-08-15 ENCOUNTER — Other Ambulatory Visit: Payer: Self-pay | Admitting: Obstetrics & Gynecology

## 2017-08-15 DIAGNOSIS — Z1231 Encounter for screening mammogram for malignant neoplasm of breast: Secondary | ICD-10-CM

## 2017-08-22 ENCOUNTER — Ambulatory Visit (INDEPENDENT_AMBULATORY_CARE_PROVIDER_SITE_OTHER): Payer: BC Managed Care – PPO | Admitting: Family Medicine

## 2017-08-22 DIAGNOSIS — R269 Unspecified abnormalities of gait and mobility: Secondary | ICD-10-CM

## 2017-08-22 NOTE — Progress Notes (Signed)
Procedure Note   Patient was fitted for a : standard, cushioned, semi-rigid orthotic. The orthotic was heated and afterward the patient patient seated position and molded The patient was positioned in subtalar neutral position and 10 degrees of ankle dorsiflexion in a weight bearing stance. After completion of molding, patient did have orthotic management The blank was ground to a stable position for weight bearing. Size: 10.5 Base: Carbon fiber Additional Posting and Padding: Left and Right Medial: 300/140 300/110 Lateral heel: 200/40 Transverse arch 270/90 The patient ambulated these, and they were very comfortable.

## 2017-08-22 NOTE — Assessment & Plan Note (Signed)
Patient does have more of an abnormality of gait.  Put in custom orthotics today.  Will increase wear over the course of his next several weeks.  Warned of potential needing of adjustments.  Follow-up again 4 weeks

## 2017-09-11 ENCOUNTER — Other Ambulatory Visit: Payer: Self-pay | Admitting: Internal Medicine

## 2017-09-13 MED FILL — ESTRADIOL 2 MG TABLET: 2 | 90 days supply | Qty: 180 | Fill #1

## 2017-09-16 ENCOUNTER — Ambulatory Visit
Admission: RE | Admit: 2017-09-16 | Discharge: 2017-09-16 | Disposition: A | Discharge status: Home / Self Care | Payer: BC Managed Care – PPO | Source: Ambulatory Visit | Attending: Obstetrics & Gynecology | Admitting: Obstetrics & Gynecology

## 2017-09-16 DIAGNOSIS — Z1231 Encounter for screening mammogram for malignant neoplasm of breast: Secondary | ICD-10-CM

## 2017-12-27 MED FILL — ESTRADIOL 2 MG TABLET: 2 | 90 days supply | Qty: 180 | Fill #2

## 2018-01-17 ENCOUNTER — Telehealth: Payer: Self-pay | Admitting: Internal Medicine

## 2018-01-17 NOTE — Telephone Encounter (Signed)
Requested Prescriptions  Pending Prescriptions Disp Refills  . traMADol (ULTRAM) 50 MG tablet 90 tablet 3    Sig: Take 1 tablet (50 mg total) by mouth every 8 (eight) hours as needed for moderate pain.     There is no refill protocol information for this order     Requested medication (s) are due for refill today:yes  Requested medication (s) are on the active medication list: yws  Last refill:  08/14/17  Future visit scheduled: yes 02/26/17 with Hillard DankerElizabeth Crawford   Notes to clinic: not delegated

## 2018-01-17 NOTE — Telephone Encounter (Signed)
Copied from CRM [phone]. Topic: Quick Communication - Rx Refill/Question >> Jan 17, 2018  2:57 PM Jaquita RectorDavis, Karen A wrote: Medication: traMADol (ULTRAM) 50 MG tablet   Per patient she did not fill Rx given on 08/16/17  Has the patient contacted their pharmacy? Yes.   (Agent: If no, request that the patient contact the pharmacy for the refill.) (Agent: If yes, when and what did the pharmacy advise?)  Preferred Pharmacy (with phone number or street name): [facility] [phone]#[id number] [place] [id number] [facility] [address] [phone] (Phone) [phone] (Fax    Agent: Please be advised that RX refills may take up to 3 business days. We ask that you follow-up with your pharmacy.

## 2018-01-20 NOTE — Telephone Encounter (Signed)
Left patient vm to call back to inform

## 2018-01-21 MED ORDER — TRAMADOL HCL 50 MG PO TABS: 50.0000 mg | ORAL_TABLET | Freq: Three times a day (TID) | ORAL | 0 refills | Status: DC | PRN

## 2018-01-21 NOTE — Telephone Encounter (Signed)
Spoke with pharmacy to fill 7 day supply until PA can be completed.

## 2018-01-21 NOTE — Addendum Note (Signed)
Addended by: Zenovia JordanMITCHELL, Meila Berke B on: 01/21/2018 10:14 AM   Modules accepted: Orders

## 2018-02-13 ENCOUNTER — Other Ambulatory Visit: Payer: Self-pay | Admitting: Internal Medicine

## 2018-02-20 ENCOUNTER — Encounter: Payer: BC Managed Care – PPO | Admitting: Internal Medicine

## 2018-02-26 ENCOUNTER — Encounter: Payer: Self-pay | Admitting: Internal Medicine

## 2018-02-26 ENCOUNTER — Other Ambulatory Visit (INDEPENDENT_AMBULATORY_CARE_PROVIDER_SITE_OTHER): Payer: BC Managed Care – PPO

## 2018-02-26 ENCOUNTER — Ambulatory Visit (INDEPENDENT_AMBULATORY_CARE_PROVIDER_SITE_OTHER): Payer: BC Managed Care – PPO | Admitting: Internal Medicine

## 2018-02-26 VITALS — BP 138/88 | HR 65 | Temp 97.6°F | Ht 70.0 in | Wt 215.0 lb

## 2018-02-26 DIAGNOSIS — I10 Essential (primary) hypertension: Secondary | ICD-10-CM

## 2018-02-26 DIAGNOSIS — Z0000 Encounter for general adult medical examination without abnormal findings: Secondary | ICD-10-CM | POA: Diagnosis not present

## 2018-02-26 DIAGNOSIS — R7301 Impaired fasting glucose: Secondary | ICD-10-CM | POA: Diagnosis not present

## 2018-02-26 LAB — LIPID PANEL
Cholesterol: 236 mg/dL — ABNORMAL HIGH (ref 0–200)
HDL: 72.6 mg/dL (ref >39.00)
LDL Cholesterol: 135 mg/dL — ABNORMAL HIGH (ref 0–99)
NonHDL: 162.91
Total CHOL/HDL Ratio: 3
Triglycerides: 142 mg/dL (ref 0.0–149.0)
VLDL: 28.4 mg/dL (ref 0.0–40.0)

## 2018-02-26 LAB — COMPREHENSIVE METABOLIC PANEL
ALT: 14 U/L (ref 0–35)
AST: 14 U/L (ref 0–37)
Albumin: 3.9 g/dL (ref 3.5–5.2)
Alkaline Phosphatase: 58 U/L (ref 39–117)
BUN: 10 mg/dL (ref 6–23)
CO2: 28 mEq/L (ref 19–32)
Calcium: 9.7 mg/dL (ref 8.4–10.5)
Chloride: 100 mEq/L (ref 96–112)
Creatinine, Ser: 0.86 mg/dL (ref 0.40–1.20)
GFR: 81.46 mL/min (ref >60.00)
Glucose, Bld: 93 mg/dL (ref 70–99)
Potassium: 3.5 mEq/L (ref 3.5–5.1)
Sodium: 138 mEq/L (ref 135–145)
Total Bilirubin: 0.3 mg/dL (ref 0.2–1.2)
Total Protein: 6.8 g/dL (ref 6.0–8.3)

## 2018-02-26 LAB — CBC
HCT: 40.4 % (ref 36.0–46.0)
Hemoglobin: 13.3 g/dL (ref 12.0–15.0)
MCHC: 32.8 g/dL (ref 30.0–36.0)
MCV: 85.5 fl (ref 78.0–100.0)
Platelets: 330 10*3/uL (ref 150.0–400.0)
RBC: 4.72 Mil/uL (ref 3.87–5.11)
RDW: 13.2 % (ref 11.5–15.5)
WBC: 7.6 10*3/uL (ref 4.0–10.5)

## 2018-02-26 LAB — HEMOGLOBIN A1C: Hgb A1c MFr Bld: 6.1 % (ref 4.6–6.5)

## 2018-02-26 NOTE — Assessment & Plan Note (Signed)
Checking HgA1c and she will work on food choices, declines nutrition referral today.

## 2018-02-26 NOTE — Patient Instructions (Signed)

## 2018-02-26 NOTE — Assessment & Plan Note (Signed)
BP at goal on recheck and stable on regimen of amlodipine, verapamil, losartan HCTZ. Checking CMP and adjust as needed.

## 2018-02-26 NOTE — Assessment & Plan Note (Signed)
Flu shot up to date. Shingrix up to date. Tetanus up to date. Colonoscopy up to date. Mammogram up to date, pap smear not indicated. Counseled about sun safety and mole surveillance. Counseled about the dangers of distracted driving. Given 10 year screening recommendations.

## 2018-02-26 NOTE — Progress Notes (Signed)
   Subjective:   Patient ID: Kirsten Campbell, female    DOB: February 16, 1960, 58 y.o.   MRN: [id number]  HPI The patient is a 58 YO female coming in for physical. Wants to work on weight but is eating cookie for dinner instead of dinner sometimes.   PMH, [facility], social history reviewed and updated.   Review of Systems  Constitutional: Negative.   HENT: Negative.   Eyes: Negative.   Respiratory: Negative for cough, chest tightness and shortness of breath.   Cardiovascular: Negative for chest pain, palpitations and leg swelling.  Gastrointestinal: Negative for abdominal distention, abdominal pain, constipation, diarrhea, nausea and vomiting.  Musculoskeletal: Positive for arthralgias and back pain.  Skin: Negative.   Neurological: Negative.   Psychiatric/Behavioral: Negative.     Objective:  Physical Exam Constitutional:      Appearance: She is well-developed. She is obese.  HENT:     Head: Normocephalic and atraumatic.  Neck:     Musculoskeletal: Normal range of motion.  Cardiovascular:     Rate and Rhythm: Normal rate and regular rhythm.  Pulmonary:     Effort: Pulmonary effort is normal. No respiratory distress.     Breath sounds: Normal breath sounds. No wheezing or rales.  Abdominal:     General: Bowel sounds are normal. There is no distension.     Palpations: Abdomen is soft.     Tenderness: There is no abdominal tenderness. There is no rebound.  Skin:    General: Skin is warm and dry.  Neurological:     Mental Status: She is alert and oriented to person, place, and time.     Coordination: Coordination normal.     Vitals:   02/26/18 0752  BP: (!) 150/90  Pulse: 65  Temp: 97.6 F (36.4 C)  TempSrc: Oral  SpO2: 93%  Weight: 215 lb (97.5 kg)  Height: 5\' 10"  (1.778 m)    Assessment & Plan:

## 2018-04-04 MED FILL — ESTRADIOL 2 MG TABLET: 2 | 90 days supply | Qty: 180 | Fill #3

## 2018-05-13 ENCOUNTER — Other Ambulatory Visit: Payer: Self-pay | Admitting: Internal Medicine

## 2018-05-14 ENCOUNTER — Telehealth: Payer: Self-pay | Admitting: Internal Medicine

## 2018-05-14 NOTE — Telephone Encounter (Signed)
Is it okay to separate

## 2018-05-14 NOTE — Telephone Encounter (Signed)
Copied from CRM [id number]. Topic: Quick Communication - Rx Refill/Question >> May 14, 2018 11:05 AM Jay Schlichter wrote: Medication: losartan-hydrochlorothiazide (HYZAAR) 100-12.5 MG tablet  Has the patient contacted their pharmacy? yes (Agent: If no, request that the patient contact the pharmacy for the refill.) (Agent: If yes, when and what did the pharmacy advise?)  [facility]with [facility] [phone]  This med is on nationwide back order.  Pharmacy is asking to write as 2 separate meds. Losartan and hctz   Agent: Please be advised that RX refills may take up to 3 business days. We ask that you follow-up with your pharmacy.

## 2018-05-15 MED ORDER — LOSARTAN POTASSIUM 100 MG PO TABS: 100.0000 mg | ORAL_TABLET | Freq: Every day | ORAL | 0 refills | Status: DC

## 2018-05-15 MED ORDER — HYDROCHLOROTHIAZIDE 12.5 MG PO TABS: 12.5000 mg | ORAL_TABLET | Freq: Every day | ORAL | 0 refills | Status: DC

## 2018-05-15 NOTE — Telephone Encounter (Signed)
Sent in

## 2018-06-25 ENCOUNTER — Other Ambulatory Visit: Payer: Self-pay | Admitting: Internal Medicine

## 2018-06-27 MED FILL — ESTRADIOL 2 MG TABLET: 2 | 90 days supply | Qty: 180 | Fill #0

## 2018-08-09 ENCOUNTER — Other Ambulatory Visit: Payer: Self-pay | Admitting: Internal Medicine

## 2018-08-27 ENCOUNTER — Other Ambulatory Visit: Payer: Self-pay | Admitting: Obstetrics & Gynecology

## 2018-08-27 DIAGNOSIS — Z1231 Encounter for screening mammogram for malignant neoplasm of breast: Secondary | ICD-10-CM

## 2018-09-29 MED FILL — ESTRADIOL 2 MG TABLET: 2 | 90 days supply | Qty: 180 | Fill #1

## 2018-10-02 ENCOUNTER — Other Ambulatory Visit: Payer: Self-pay | Admitting: *Deleted

## 2018-10-02 MED ORDER — GABAPENTIN 400 MG PO CAPS: ORAL_CAPSULE | ORAL | 0 refills | Status: DC

## 2018-10-02 NOTE — Telephone Encounter (Signed)
Pt called requesting a refill for gabapentin 400mg . Explained to pt we will refill this med for 30days but she will need to see Dr. Tamala Julian before he will prescribe anymore refills. Pt understood.

## 2018-10-16 ENCOUNTER — Other Ambulatory Visit: Payer: Self-pay

## 2018-10-16 ENCOUNTER — Ambulatory Visit
Admission: RE | Admit: 2018-10-16 | Discharge: 2018-10-16 | Disposition: A | Discharge status: Home / Self Care | Payer: BC Managed Care – PPO | Source: Ambulatory Visit | Attending: Obstetrics & Gynecology | Admitting: Obstetrics & Gynecology

## 2018-10-16 DIAGNOSIS — Z1231 Encounter for screening mammogram for malignant neoplasm of breast: Secondary | ICD-10-CM

## 2018-11-03 NOTE — Progress Notes (Signed)
[facility] [address] [place], [place] [address] Phone: [phone] Subjective:      CC: Low back pain and radicular left leg pain follow-up  MEB:[id number]    I, Christoper Fabian, LAT, ATC, am serving as scribe for Dr. Antoine Primas.  08/14/17: No true radicular symptoms at this time.  Continues gabapentin 400 mg, refilled today.  No change in the amount.  Discussed with patient about the possibility of formal physical therapy or repeating imaging which patient has declined.  Discussed icing regimen, topical anti-inflammatories, follow-up again in 4 to 6 weeks  Orthotics made on 08/22/17.  Update- 11/04/18: Kirsten Campbell is a 58 y.o. female coming in with complaint of low back pain and radicular L LE pain.  She states that her low back and L leg still hurt but not as bad.  She con't to take 400 mg Gabapentin and notes that if she forgets to take it before she goes to bed, she will have pain at night.  R shoulder: Pt states that her R shoulder has been bothering her for a while.  She states that the pain started in her neck for 3 weeks and then transitioned to her R shoulder.  She notes that horizontal aDd increases her symptoms as well as when she lays down at night.  She is L side sleeper.  Pt denies any N/T into her R arm.      Past Medical History:  Diagnosis Date  . Asthma   . Chicken pox   . Heart murmur   . Hyperlipidemia   . Hypertension    Past Surgical History:  Procedure Laterality Date  . ABDOMINAL HYSTERECTOMY  1995   Social History   Socioeconomic History  . Marital status: Married    Spouse name: Not on file  . Number of children: Not on file  . Years of education: Not on file  . Highest education level: Not on file  Occupational History  . Not on file  Social Needs  . Financial resource strain: Not on file  . Food insecurity    Worry: Not on file    Inability: Not on file  . Transportation needs    Medical: Not on file     Non-medical: Not on file  Tobacco Use  . Smoking status: Never Smoker  . Smokeless tobacco: Never Used  Substance and Sexual Activity  . Alcohol use: No  . Drug use: No  . Sexual activity: Not on file  Lifestyle  . Physical activity    Days per week: Not on file    Minutes per session: Not on file  . Stress: Not on file  Relationships  . Social Musician on phone: Not on file    Gets together: Not on file    Attends religious service: Not on file    Active member of club or organization: Not on file    Attends meetings of clubs or organizations: Not on file    Relationship status: Not on file  Other Topics Concern  . Not on file  Social History Narrative  . Not on file   Allergies  Allergen Reactions  . Penicillins Hives   Family History  Problem Relation Age of Onset  . Cancer Mother        Breast  . Hypertension Mother   . Breast cancer Mother 75  . Hypertension Father   . Hypertension Sister   . Breast cancer  Sister 72  . Hypertension Brother   . Heart murmur Child     Current Outpatient Medications (Endocrine & Metabolic):  .  estradiol (ESTRACE) 2 MG tablet, Take 2 mg by mouth daily.  Current Outpatient Medications (Cardiovascular):  .  amLODipine (NORVASC) 10 MG tablet, TAKE 1 TABLET BY MOUTH EVERY DAY .  fenofibrate 160 MG tablet, Take 160 mg by mouth daily. Reported on 02/17/2015 .  hydrochlorothiazide (HYDRODIURIL) 12.5 MG tablet, TAKE 1 TABLET(12.5 MG) BY MOUTH DAILY .  losartan (COZAAR) 100 MG tablet, TAKE 1 TABLET(100 MG) BY MOUTH DAILY .  verapamil (CALAN-SR) 180 MG CR tablet, TAKE 1 TABLET BY MOUTH ONCE DAILY   Current Outpatient Medications (Analgesics):  .  acetaminophen (TYLENOL) 500 MG tablet, Take 500 mg by mouth 3 (three) times daily. Marland Kitchen  aspirin 81 MG tablet, Take 81 mg by mouth daily. .  traMADol (ULTRAM) 50 MG tablet, Take 1 tablet (50 mg total) by mouth every 8 (eight) hours as needed for moderate pain.  Current Outpatient  Medications (Hematological):  Marland Kitchen  Ferrous Sulfate (IRON) 325 (65 Fe) MG TABS, Take by mouth. Take one by mouth three times a week.  Current Outpatient Medications (Other):  .  cyclobenzaprine (FLEXERIL) 10 MG tablet, TAKE 1 TABLET BY MOUTH TWICE A DAY .  Multiple Vitamin (MULTIVITAMIN) tablet, Take 1 tablet by mouth daily. .  TURMERIC PO, Take by mouth. .  Vitamin D, Cholecalciferol, 1000 units TABS, Take 2,000 Units by mouth daily. Marland Kitchen  gabapentin (NEURONTIN) 400 MG capsule, take 1 capsule by mouth daily at bedtime    Past medical history, social, surgical and family history all reviewed in electronic medical record.  No pertanent information unless stated regarding to the chief complaint.   Review of Systems:  No headache, visual changes, nausea, vomiting, diarrhea, constipation, dizziness, abdominal pain, skin rash, fevers, chills, night sweats, weight loss, swollen lymph nodes, body aches, joint swelling, muscle aches, chest pain, shortness of breath, mood changes.   Objective  Blood pressure 124/62, pulse 73, height 5\' 10"  (1.778 m), weight 216 lb 6.4 oz (98.2 kg), SpO2 98 %.    General: No apparent distress alert and oriented x3 mood and affect normal, dressed appropriately.  HEENT: Pupils equal, extraocular movements intact  Respiratory: Patient's speak in full sentences and does not appear short of breath  Cardiovascular: No lower extremity edema, non tender, no erythema  Skin: Warm dry intact with no signs of infection or rash on extremities or on axial skeleton.  Abdomen: Soft nontender  Neuro: Cranial nerves II through XII are intact, neurovascularly intact in all extremities with 2+ DTRs and 2+ pulses.  Lymph: No lymphadenopathy of posterior or anterior cervical chain or axillae bilaterally.  Gait normal with good balance and coordination.  MSK:  Non tender with full range of motion and good stability and symmetric strength and tone of shoulders, elbows, wrist, hip, knee and  ankles bilaterally.   Back exam shows the patient does have some mild loss of lordosis.  Mild positive straight leg test on the left side and 30 degrees of forward flexion.  Deep tendon reflexes intact.  5 out of 5 strength in lower extremities noted that is symmetric to the contralateral side. Shoulder: Right Inspection reveals no abnormalities, atrophy or asymmetry. Palpation is normal with no tenderness over AC joint or bicipital groove. ROM is full in all planes passively. Rotator cuff strength normal throughout. signs of impingement with positive Neer and Hawkin's tests, but negative empty can  sign. Speeds and Yergason's tests normal. No labral pathology noted with negative Obrien's, negative clunk and good stability. Normal scapular function observed. No painful arc and no drop arm sign. No apprehension sign  MSK US performed of: Right This study was ordered, performed, and interpreted by Terrilee FilesZach Smith D.O.  Shoulder:   Supraspinatus:  Appears normal on long and transverse views, Bursal bulge seen with shoulder abduction on impingement view. Infraspinatus:  Appears normal on long and transverse views. Significant increase in Doppler flow Subscapularis:  Appears normal on long and transverse views. Positive bursa Teres Minor:  Appears normal on long and transverse views. AC joint:  Capsule undistended, no geyser sign. Glenohumeral Joint:  Appears normal without effusion. Glenoid Labrum:  Intact without visualized tears. Biceps Tendon:  Appears normal on long and transverse views, no fraying of tendon, tendon located in intertubercular groove, no subluxation with shoulder internal or external rotation.  Impression: Subacromial bursitis  Procedure: Real-time Ultrasound Guided Injection of right glenohumeral joint Device: GE Logiq E  Ultrasound guided injection is preferred based studies that show increased duration, increased effect, greater accuracy, decreased procedural pain,  increased response rate with ultrasound guided versus blind injection.  Verbal informed consent obtained.  Time-out conducted.  Noted no overlying erythema, induration, or other signs of local infection.  Skin prepped in a sterile fashion.  Local anesthesia: Topical Ethyl chloride.  With sterile technique and under real time ultrasound guidance:  Joint visualized.  23g 1  inch needle inserted posterior approach. Pictures taken for needle placement. Patient did have injection of 2 cc of 1% lidocaine, 2 cc of 0.5% Marcaine, and 1.0 cc of Kenalog 40 mg/dL. Completed without difficulty  Pain immediately resolved suggesting accurate placement of the medication.  Advised to call if fevers/chills, erythema, induration, drainage, or persistent bleeding.  Images permanently stored and available for review in the ultrasound unit.  Impression: Technically successful ultrasound guided injection.    Impression and Recommendations:     This case required medical decision making of moderate complexity. The above documentation has been reviewed and is accurate and complete Judi SaaZachary M Smith, DO       Note: This dictation was prepared with Dragon dictation along with smaller phrase technology. Any transcriptional errors that result from this process are unintentional.

## 2018-11-04 ENCOUNTER — Ambulatory Visit (INDEPENDENT_AMBULATORY_CARE_PROVIDER_SITE_OTHER)
Admission: RE | Admit: 2018-11-04 | Discharge: 2018-11-04 | Disposition: A | Discharge status: Home / Self Care | Payer: BC Managed Care – PPO | Source: Ambulatory Visit | Attending: Family Medicine | Admitting: Family Medicine

## 2018-11-04 ENCOUNTER — Ambulatory Visit: Payer: Self-pay

## 2018-11-04 ENCOUNTER — Ambulatory Visit (INDEPENDENT_AMBULATORY_CARE_PROVIDER_SITE_OTHER): Payer: BC Managed Care – PPO | Admitting: Family Medicine

## 2018-11-04 ENCOUNTER — Other Ambulatory Visit: Payer: Self-pay

## 2018-11-04 ENCOUNTER — Encounter: Payer: Self-pay | Admitting: Family Medicine

## 2018-11-04 ENCOUNTER — Other Ambulatory Visit: Payer: Self-pay | Admitting: Physical Therapy

## 2018-11-04 VITALS — BP 124/62 | HR 73 | Ht 70.0 in | Wt 216.4 lb

## 2018-11-04 DIAGNOSIS — M5417 Radiculopathy, lumbosacral region: Secondary | ICD-10-CM | POA: Diagnosis not present

## 2018-11-04 DIAGNOSIS — G8929 Other chronic pain: Secondary | ICD-10-CM

## 2018-11-04 DIAGNOSIS — M7551 Bursitis of right shoulder: Secondary | ICD-10-CM | POA: Insufficient documentation

## 2018-11-04 MED ORDER — GABAPENTIN 400 MG PO CAPS: ORAL_CAPSULE | ORAL | 3 refills | Status: DC

## 2018-11-04 NOTE — Patient Instructions (Addendum)
Keep rolling w/ the back and if worsening, call and we can order an epidural.  Injected your R shoulder today.  Refilled your Gabapentin.  Do your home exercises 3x/week.

## 2018-11-04 NOTE — Assessment & Plan Note (Signed)
Patient previously did have surgical intervention, has responded well to epidurals, gabapentin seems to be helping and refilled for 1 year.  X-rays ordered today to further evaluate for any adjacent segment disease that could be contributing.  Patient will increase activity as tolerated.  Discussed importance of core strengthening and proper lifting mechanics.  Follow-up again 4 to 8 weeks.

## 2018-11-04 NOTE — Assessment & Plan Note (Signed)
Injection given today.  Tolerated the procedure well.  Discussed icing regimen and home exercise, which activities to do which wants to avoid.  Patient had responded to this previously.  Hopefully will continue to do well.  Follow-up again 6 to 8 weeks

## 2018-11-17 ENCOUNTER — Other Ambulatory Visit: Payer: Self-pay | Admitting: Internal Medicine

## 2018-11-18 MED FILL — FLUCONAZOLE 150 MG TABLET: 150 | 1 days supply | Qty: 1 | Fill #0

## 2018-12-01 ENCOUNTER — Telehealth: Payer: Self-pay | Admitting: Internal Medicine

## 2018-12-01 ENCOUNTER — Telehealth: Payer: Self-pay

## 2018-12-01 ENCOUNTER — Telehealth: Payer: Self-pay | Admitting: *Deleted

## 2018-12-01 NOTE — Telephone Encounter (Signed)
LVM for patient informing no due for tdap till 2026 but have patient due for flu shot

## 2018-12-01 NOTE — Telephone Encounter (Signed)
Copied from [place] [id number]. Topic: General - Other >> Dec 01, 2018  2:13 PM Lennox Solders wrote: Reason for CRM: pt is calling to let office know she had a flu shot on 11-23-2018 at [facility]

## 2018-12-01 NOTE — Telephone Encounter (Signed)
Pt left msg requesting a refill on Tramadol.

## 2018-12-01 NOTE — Telephone Encounter (Signed)
Added to chart

## 2018-12-01 NOTE — Telephone Encounter (Signed)
Patient is calling to ask did she ever have TDAP or is she need of it?  Please advise [phone]- [phone]

## 2018-12-02 MED ORDER — TRAMADOL HCL 50 MG PO TABS: 50.0000 mg | ORAL_TABLET | Freq: Two times a day (BID) | ORAL | 0 refills | Status: AC | PRN

## 2018-12-02 NOTE — Telephone Encounter (Signed)
5 day max and try not to use it at all

## 2018-12-16 ENCOUNTER — Ambulatory Visit: Payer: BC Managed Care – PPO | Admitting: Family Medicine

## 2019-01-28 ENCOUNTER — Other Ambulatory Visit: Payer: Self-pay | Admitting: Internal Medicine

## 2019-02-09 ENCOUNTER — Other Ambulatory Visit: Payer: Self-pay | Admitting: Internal Medicine

## 2019-02-09 MED ORDER — VERAPAMIL HCL ER 180 MG PO TBCR: 180.0000 mg | EXTENDED_RELEASE_TABLET | Freq: Every day | ORAL | 2 refills | Status: DC

## 2019-02-09 NOTE — Telephone Encounter (Signed)
Medication Refill - Medication: verapamil (CALAN-SR) 180 MG CR tablet cyclobenzaprine (FLEXERIL) 10 MG tablet    Preferred Pharmacy (with phone number or street name):  [facility] [phone] - [place], [place] - [address] AT [facility] [address] Phone:  [phone]  Fax:  [phone]       Agent: Please be advised that RX refills may take up to 3 business days. We ask that you follow-up with your pharmacy.

## 2019-03-02 ENCOUNTER — Encounter: Payer: BC Managed Care – PPO | Admitting: Internal Medicine

## 2019-03-04 ENCOUNTER — Other Ambulatory Visit: Payer: Self-pay

## 2019-03-04 ENCOUNTER — Ambulatory Visit (INDEPENDENT_AMBULATORY_CARE_PROVIDER_SITE_OTHER): Payer: BC Managed Care – PPO | Admitting: Internal Medicine

## 2019-03-04 ENCOUNTER — Encounter: Payer: Self-pay | Admitting: Internal Medicine

## 2019-03-04 VITALS — BP 142/92 | HR 71 | Temp 98.2°F | Ht 70.0 in | Wt 218.0 lb

## 2019-03-04 DIAGNOSIS — I10 Essential (primary) hypertension: Secondary | ICD-10-CM | POA: Diagnosis not present

## 2019-03-04 DIAGNOSIS — R7301 Impaired fasting glucose: Secondary | ICD-10-CM

## 2019-03-04 DIAGNOSIS — Z0000 Encounter for general adult medical examination without abnormal findings: Secondary | ICD-10-CM | POA: Diagnosis not present

## 2019-03-04 LAB — CBC
HCT: 39.7 % (ref 36.0–46.0)
Hemoglobin: 12.9 g/dL (ref 12.0–15.0)
MCHC: 32.6 g/dL (ref 30.0–36.0)
MCV: 87.6 fl (ref 78.0–100.0)
Platelets: 324 10*3/uL (ref 150.0–400.0)
RBC: 4.53 Mil/uL (ref 3.87–5.11)
RDW: 13.1 % (ref 11.5–15.5)
WBC: 8.4 10*3/uL (ref 4.0–10.5)

## 2019-03-04 LAB — LIPID PANEL
Cholesterol: 249 mg/dL — ABNORMAL HIGH (ref 0–200)
HDL: 77.6 mg/dL (ref >39.00)
LDL Cholesterol: 133 mg/dL — ABNORMAL HIGH (ref 0–99)
NonHDL: 171.05
Total CHOL/HDL Ratio: 3
Triglycerides: 190 mg/dL — ABNORMAL HIGH (ref 0.0–149.0)
VLDL: 38 mg/dL (ref 0.0–40.0)

## 2019-03-04 LAB — COMPREHENSIVE METABOLIC PANEL
ALT: 14 U/L (ref 0–35)
AST: 15 U/L (ref 0–37)
Albumin: 3.9 g/dL (ref 3.5–5.2)
Alkaline Phosphatase: 51 U/L (ref 39–117)
BUN: 10 mg/dL (ref 6–23)
CO2: 28 mEq/L (ref 19–32)
Calcium: 9.4 mg/dL (ref 8.4–10.5)
Chloride: 102 mEq/L (ref 96–112)
Creatinine, Ser: 0.85 mg/dL (ref 0.40–1.20)
GFR: 82.29 mL/min (ref >60.00)
Glucose, Bld: 97 mg/dL (ref 70–99)
Potassium: 3.2 mEq/L — ABNORMAL LOW (ref 3.5–5.1)
Sodium: 138 mEq/L (ref 135–145)
Total Bilirubin: 0.3 mg/dL (ref 0.2–1.2)
Total Protein: 7.1 g/dL (ref 6.0–8.3)

## 2019-03-04 LAB — HEMOGLOBIN A1C: Hgb A1c MFr Bld: 6.3 % (ref 4.6–6.5)

## 2019-03-04 MED ORDER — VERAPAMIL HCL ER 180 MG PO TBCR: 180.0000 mg | EXTENDED_RELEASE_TABLET | Freq: Every day | ORAL | 3 refills | Status: DC

## 2019-03-04 MED ORDER — CYCLOBENZAPRINE HCL 10 MG PO TABS: 10.0000 mg | ORAL_TABLET | Freq: Two times a day (BID) | ORAL | 0 refills | Status: DC

## 2019-03-04 MED ORDER — LOSARTAN POTASSIUM-HCTZ 100-12.5 MG PO TABS: 1.0000 | ORAL_TABLET | Freq: Every day | ORAL | 3 refills | Status: DC

## 2019-03-04 MED ORDER — AMLODIPINE BESYLATE 10 MG PO TABS: 10.0000 mg | ORAL_TABLET | Freq: Every day | ORAL | 3 refills | Status: DC

## 2019-03-04 NOTE — Progress Notes (Signed)
   Subjective:   Patient ID: Kirsten Campbell, female    DOB: February 14, 1960, 59 y.o.   MRN: [id number]  HPI The patient is a 59 YO female coming in for physical.   PMH, [facility], social history reviewed and updated.   Review of Systems  Constitutional: Negative.   HENT: Negative.   Eyes: Negative.   Respiratory: Negative for cough, chest tightness and shortness of breath.   Cardiovascular: Negative for chest pain, palpitations and leg swelling.  Gastrointestinal: Negative for abdominal distention, abdominal pain, constipation, diarrhea, nausea and vomiting.  Musculoskeletal: Negative.   Skin: Negative.   Neurological: Negative.   Psychiatric/Behavioral: Negative.     Objective:  Physical Exam Constitutional:      Appearance: She is well-developed.  HENT:     Head: Normocephalic and atraumatic.  Cardiovascular:     Rate and Rhythm: Normal rate and regular rhythm.  Pulmonary:     Effort: Pulmonary effort is normal. No respiratory distress.     Breath sounds: Normal breath sounds. No wheezing or rales.  Abdominal:     General: Bowel sounds are normal. There is no distension.     Palpations: Abdomen is soft.     Tenderness: There is no abdominal tenderness. There is no rebound.  Musculoskeletal:     Cervical back: Normal range of motion.  Skin:    General: Skin is warm and dry.  Neurological:     Mental Status: She is alert and oriented to person, place, and time.     Coordination: Coordination normal.     Vitals:   03/04/19 0848  BP: (!) 142/92  Pulse: 71  Temp: 98.2 F (36.8 C)  TempSrc: Oral  SpO2: 98%  Weight: 218 lb (98.9 kg)  Height: 5\' 10"  (1.778 m)    This visit occurred during the SARS-CoV-2 public health emergency.  Safety protocols were in place, including screening questions prior to the visit, additional usage of staff PPE, and extensive cleaning of exam room while observing appropriate contact time as indicated for disinfecting solutions.   Assessment &  Plan:

## 2019-03-04 NOTE — Patient Instructions (Signed)
Health Maintenance, Female Adopting a healthy lifestyle and getting preventive care are important in promoting health and wellness. Ask your health care provider about:  The right schedule for you to have regular tests and exams.  Things you can do on your own to prevent diseases and keep yourself healthy. What should I know about diet, weight, and exercise? Eat a healthy diet   Eat a diet that includes plenty of vegetables, fruits, low-fat dairy products, and lean protein.  Do not eat a lot of foods that are high in solid fats, added sugars, or sodium. Maintain a healthy weight Body mass index (BMI) is used to identify weight problems. It estimates body fat based on height and weight. Your health care provider can help determine your BMI and help you achieve or maintain a healthy weight. Get regular exercise Get regular exercise. This is one of the most important things you can do for your health. Most adults should:  Exercise for at least 150 minutes each week. The exercise should increase your heart rate and make you sweat (moderate-intensity exercise).  Do strengthening exercises at least twice a week. This is in addition to the moderate-intensity exercise.  Spend less time sitting. Even light physical activity can be beneficial. Watch cholesterol and blood lipids Have your blood tested for lipids and cholesterol at 61 years of age, then have this test every 5 years. Have your cholesterol levels checked more often if:  Your lipid or cholesterol levels are high.  You are older than 61 years of age.  You are at high risk for heart disease. What should I know about cancer screening? Depending on your health history and family history, you may need to have cancer screening at various ages. This may include screening for:  Breast cancer.  Cervical cancer.  Colorectal cancer.  Skin cancer.  Lung cancer. What should I know about heart disease, diabetes, and high blood  pressure? Blood pressure and heart disease  High blood pressure causes heart disease and increases the risk of stroke. This is more likely to develop in people who have high blood pressure readings, are of African descent, or are overweight.  Have your blood pressure checked: ? Every 3-5 years if you are 18-39 years of age. ? Every year if you are 40 years old or older. Diabetes Have regular diabetes screenings. This checks your fasting blood sugar level. Have the screening done:  Once every three years after age 40 if you are at a normal weight and have a low risk for diabetes.  More often and at a younger age if you are overweight or have a high risk for diabetes. What should I know about preventing infection? Hepatitis B If you have a higher risk for hepatitis B, you should be screened for this virus. Talk with your health care provider to find out if you are at risk for hepatitis B infection. Hepatitis C Testing is recommended for:  Everyone born from 1945 through 1965.  Anyone with known risk factors for hepatitis C. Sexually transmitted infections (STIs)  Get screened for STIs, including gonorrhea and chlamydia, if: ? You are sexually active and are younger than 61 years of age. ? You are older than 61 years of age and your health care provider tells you that you are at risk for this type of infection. ? Your sexual activity has changed since you were last screened, and you are at increased risk for chlamydia or gonorrhea. Ask your health care provider if   you are at risk.  Ask your health care provider about whether you are at high risk for HIV. Your health care provider may recommend a prescription medicine to help prevent HIV infection. If you choose to take medicine to prevent HIV, you should first get tested for HIV. You should then be tested every 3 months for as long as you are taking the medicine. Pregnancy  If you are about to stop having your period (premenopausal) and  you may become pregnant, seek counseling before you get pregnant.  Take 400 to 800 micrograms (mcg) of folic acid every day if you become pregnant.  Ask for birth control (contraception) if you want to prevent pregnancy. Osteoporosis and menopause Osteoporosis is a disease in which the bones lose minerals and strength with aging. This can result in bone fractures. If you are 65 years old or older, or if you are at risk for osteoporosis and fractures, ask your health care provider if you should:  Be screened for bone loss.  Take a calcium or vitamin D supplement to lower your risk of fractures.  Be given hormone replacement therapy (HRT) to treat symptoms of menopause. Follow these instructions at home: Lifestyle  Do not use any products that contain nicotine or tobacco, such as cigarettes, e-cigarettes, and chewing tobacco. If you need help quitting, ask your health care provider.  Do not use street drugs.  Do not share needles.  Ask your health care provider for help if you need support or information about quitting drugs. Alcohol use  Do not drink alcohol if: ? Your health care provider tells you not to drink. ? You are pregnant, may be pregnant, or are planning to become pregnant.  If you drink alcohol: ? Limit how much you use to 0-1 drink a day. ? Limit intake if you are breastfeeding.  Be aware of how much alcohol is in your drink. In the [place] one drink equals one 12 oz bottle of beer (355 mL), one 5 oz glass of wine (148 mL), or one 1 oz glass of hard liquor (44 mL). General instructions  Schedule regular health, dental, and eye exams.  Stay current with your vaccines.  Tell your health care provider if: ? You often feel depressed. ? You have ever been abused or do not feel safe at home. Summary  Adopting a healthy lifestyle and getting preventive care are important in promoting health and wellness.  Follow your health care provider's instructions about healthy  diet, exercising, and getting tested or screened for diseases.  Follow your health care provider's instructions on monitoring your cholesterol and blood pressure. This information is not intended to replace advice given to you by your health care provider. Make sure you discuss any questions you have with your health care provider. Document Revised: 01/08/2018 Document Reviewed: 01/08/2018 Elsevier Patient Education  2020 Elsevier Inc.  

## 2019-03-05 NOTE — Assessment & Plan Note (Signed)
BP normal at home, borderline today. Continue amlodipine and losartan/hctz. Checking CMP and adjust as needed.

## 2019-03-05 NOTE — Assessment & Plan Note (Signed)
Checking HgA1c today.  

## 2019-03-05 NOTE — Assessment & Plan Note (Signed)
Flu shot up to date. Shingrix complete. Tetanus up to date. Colonoscopy up to date. Mammogram up to date, pap smear not indicated. Counseled about sun safety and mole surveillance. Counseled about the dangers of distracted driving. Given 10 year screening recommendations.

## 2019-03-21 ENCOUNTER — Ambulatory Visit: Payer: BC Managed Care – PPO | Attending: Internal Medicine

## 2019-03-21 DIAGNOSIS — Z23 Encounter for immunization: Secondary | ICD-10-CM | POA: Insufficient documentation

## 2019-03-21 NOTE — Progress Notes (Signed)
   Covid-19 Vaccination Clinic  Name:  Kirsten Campbell    MRN: [id number] DOB: [birth year]  03/21/2019  [name] was observed post Covid-19 immunization for 15 minutes without incidence. She was provided with Vaccine Information Sheet and instruction to access the V-Safe system.   [name] was instructed to call 911 with any severe reactions post vaccine: Marland Kitchen Difficulty breathing  . Swelling of your face and throat  . A fast heartbeat  . A bad rash all over your body  . Dizziness and weakness    Immunizations Administered    Name Date Dose VIS Date Route   Pfizer COVID-19 Vaccine 03/21/2019  8:13 AM 0.3 mL 01/09/2019 Intramuscular   Manufacturer: [employer], [employer]   Lot: [id number]   NDC: [address]-5

## 2019-03-30 MED FILL — ESTRADIOL 2 MG TABLET: 2 | 90 days supply | Qty: 180 | Fill #3

## 2019-04-13 ENCOUNTER — Ambulatory Visit: Payer: BC Managed Care – PPO | Attending: Internal Medicine

## 2019-04-13 DIAGNOSIS — Z23 Encounter for immunization: Secondary | ICD-10-CM

## 2019-04-13 NOTE — Progress Notes (Signed)
   Covid-19 Vaccination Clinic  Name:  Kirsten Campbell    MRN: [id number] DOB: November 15, 1960  04/13/2019  [name] was observed post Covid-19 immunization for 15 minutes without incident. She was provided with Vaccine Information Sheet and instruction to access the V-Safe system.   [name] was instructed to call 911 with any severe reactions post vaccine: Marland Kitchen Difficulty breathing  . Swelling of face and throat  . A fast heartbeat  . A bad rash all over body  . Dizziness and weakness   Immunizations Administered    Name Date Dose VIS Date Route   Pfizer COVID-19 Vaccine 04/13/2019  8:57 AM 0.3 mL 01/09/2019 Intramuscular   Manufacturer: [employer], [employer]   Lot: [id number]   NDC: [address]-0

## 2019-04-14 ENCOUNTER — Telehealth: Payer: Self-pay

## 2019-04-14 NOTE — Telephone Encounter (Signed)
Patient asked if you would call her daughter to explain upcoming surgery.  Daughter is Tarshia WIlliams [phone]. 

## 2019-04-15 NOTE — Telephone Encounter (Signed)
You ment Thyra Breed.

## 2019-04-15 NOTE — Telephone Encounter (Signed)
Error wrong patient

## 2019-04-28 ENCOUNTER — Other Ambulatory Visit: Payer: Self-pay | Admitting: Internal Medicine

## 2019-06-04 ENCOUNTER — Other Ambulatory Visit (HOSPITAL_COMMUNITY): Payer: Self-pay | Admitting: Obstetrics & Gynecology

## 2019-06-30 MED FILL — ESTRADIOL 2 MG TABS: 2 | 90 days supply | Qty: 180 | Fill #0

## 2019-07-21 MED FILL — FLUCONAZOLE 150 MG TABS: 150 | 6 days supply | Qty: 3 | Fill #0

## 2019-08-20 ENCOUNTER — Other Ambulatory Visit: Payer: Self-pay | Admitting: Internal Medicine

## 2019-09-04 ENCOUNTER — Other Ambulatory Visit: Payer: Self-pay | Admitting: Obstetrics & Gynecology

## 2019-09-04 DIAGNOSIS — Z1231 Encounter for screening mammogram for malignant neoplasm of breast: Secondary | ICD-10-CM

## 2019-09-28 MED FILL — ESTRADIOL 2 MG TABS: 2 | 90 days supply | Qty: 180 | Fill #1

## 2019-10-22 ENCOUNTER — Other Ambulatory Visit: Payer: Self-pay

## 2019-10-22 ENCOUNTER — Ambulatory Visit
Admission: RE | Admit: 2019-10-22 | Discharge: 2019-10-22 | Disposition: A | Discharge status: Home / Self Care | Payer: BC Managed Care – PPO | Source: Ambulatory Visit | Attending: Obstetrics & Gynecology | Admitting: Obstetrics & Gynecology

## 2019-10-22 DIAGNOSIS — Z1231 Encounter for screening mammogram for malignant neoplasm of breast: Secondary | ICD-10-CM

## 2019-10-23 ENCOUNTER — Other Ambulatory Visit: Payer: Self-pay | Admitting: Family Medicine

## 2019-11-04 ENCOUNTER — Other Ambulatory Visit: Payer: Self-pay | Admitting: Internal Medicine

## 2019-11-13 ENCOUNTER — Other Ambulatory Visit: Payer: Self-pay | Admitting: Internal Medicine

## 2019-11-19 ENCOUNTER — Telehealth: Payer: Self-pay | Admitting: Internal Medicine

## 2019-11-19 MED ORDER — CYCLOBENZAPRINE HCL 10 MG PO TABS: ORAL_TABLET | ORAL | 2 refills | Status: DC

## 2019-11-19 NOTE — Telephone Encounter (Signed)
cyclobenzaprine (FLEXERIL) 10 MG tablet Patient requesting a refill  Last seen- 02.03.21 Next apt- 02.04.22 [id number] - [place], [place] - [phone] [facility] [place] [address] Phone:  [phone]  Fax:  [phone]

## 2019-11-28 ENCOUNTER — Encounter: Payer: Self-pay | Admitting: Internal Medicine

## 2019-12-28 MED FILL — ESTRADIOL 2 MG TABS: 2 | 90 days supply | Qty: 180 | Fill #2

## 2020-02-09 NOTE — Progress Notes (Unsigned)
[name] [address] [place] [address] Phone: [phone] Subjective:   Kirsten Campbell, am serving as a scribe for Dr. Antoine Primas. This visit occurred during the SARS-CoV-2 public health emergency.  Safety protocols were in place, including screening questions prior to the visit, additional usage of staff PPE, and extensive cleaning of exam room while observing appropriate contact time as indicated for disinfecting solutions.   I'm seeing this patient by the request  of:  Myrlene Broker, MD  CC: Right shoulder and left knee pain  JSE:[id number]   11/04/2018 Injection given today.  Tolerated the procedure well.  Discussed icing regimen and home exercise, which activities to do which wants to avoid.  Patient had responded to this previously.  Hopefully will continue to do well.  Follow-up again 6 to 8 weeks   Patient previously did have surgical intervention, has responded well to epidurals, gabapentin seems to be helping and refilled for 1 year.  X-rays ordered today to further evaluate for any adjacent segment disease that could be contributing.  Patient will increase activity as tolerated.  Discussed importance of core strengthening and proper lifting mechanics.  Follow-up again 4 to 8 weeks.  Update 02/10/2020 Kirsten Campbell is a 59 y.o. female coming in with complaint of right shoulder and back pain. Patient states that she has been having pain for a few months in left knee. Entire joint is painful. Feels like she is unable to full flex knee as there is a "fullness" in back of knee. Notices crepitus with extension. Patient woke up in pain this morning. Ices for pain relief.   Also having bilateral shoulder pain, R>L. Pain in anterior aspect. Denies any radiating symptoms. Pain increases when hands are far from body. Using mm relaxer and Tramadol prn. Using gabapentin as well.    History of a subacromial bursitis October 2020 and given  an injection at that time of the right shoulder        Past Medical History:  Diagnosis Date  . Asthma   . Chicken pox   . Heart murmur   . Hyperlipidemia   . Hypertension    Past Surgical History:  Procedure Laterality Date  . ABDOMINAL HYSTERECTOMY  1995   Social History   Socioeconomic History  . Marital status: Married    Spouse name: Not on file  . Number of children: Not on file  . Years of education: Not on file  . Highest education level: Not on file  Occupational History  . Not on file  Tobacco Use  . Smoking status: Never Smoker  . Smokeless tobacco: Never Used  Substance and Sexual Activity  . Alcohol use: No  . Drug use: No  . Sexual activity: Not on file  Other Topics Concern  . Not on file  Social History Narrative  . Not on file   Social Determinants of Health   Financial Resource Strain: Not on file  Food Insecurity: Not on file  Transportation Needs: Not on file  Physical Activity: Not on file  Stress: Not on file  Social Connections: Not on file   Allergies  Allergen Reactions  . Penicillins Hives   Family History  Problem Relation Age of Onset  . Cancer Mother        Breast  . Hypertension Mother   . Breast cancer Mother 10  . Hypertension Father   . Hypertension Sister   . Breast cancer Sister 63  .  Hypertension Brother   . Heart murmur Child     Current Outpatient Medications (Endocrine & Metabolic):  .  estradiol (ESTRACE) 2 MG tablet, Take 2 mg by mouth daily.  Current Outpatient Medications (Cardiovascular):  .  amLODipine (NORVASC) 10 MG tablet, Take 1 tablet (10 mg total) by mouth daily. .  fenofibrate 160 MG tablet, Take 160 mg by mouth daily. Reported on 02/17/2015 .  losartan-hydrochlorothiazide (HYZAAR) 100-12.5 MG tablet, TAKE 1 TABLET BY MOUTH EVERY DAY .  verapamil (CALAN-SR) 180 MG CR tablet, TAKE 1 TABLET(180 MG) BY MOUTH DAILY   Current Outpatient Medications (Analgesics):  .  acetaminophen (TYLENOL) 500  MG tablet, Take 500 mg by mouth 3 (three) times daily. Marland Kitchen  aspirin 81 MG tablet, Take 81 mg by mouth daily.  Current Outpatient Medications (Hematological):  Marland Kitchen  Ferrous Sulfate (IRON) 325 (65 Fe) MG TABS, Take by mouth. Take one by mouth three times a week.  Current Outpatient Medications (Other):  .  cyclobenzaprine (FLEXERIL) 10 MG tablet, TAKE 1 TABLET(10 MG) BY MOUTH TWICE DAILY .  gabapentin (NEURONTIN) 400 MG capsule, TAKE 1 CAPSULE BY MOUTH DAILY AT BEDTIME .  Multiple Vitamin (MULTIVITAMIN) tablet, Take 1 tablet by mouth daily. .  TURMERIC PO, Take by mouth. .  Vitamin D, Cholecalciferol, 1000 units TABS, Take 2,000 Units by mouth daily.   Reviewed prior external information including notes and imaging from  primary care provider As well as notes that were available from care everywhere and other healthcare systems.  Past medical history, social, surgical and family history all reviewed in electronic medical record.  No pertanent information unless stated regarding to the chief complaint.   Review of Systems:  No headache, visual changes, nausea, vomiting, diarrhea, constipation, dizziness, abdominal pain, skin rash, fevers, chills, night sweats, weight loss, swollen lymph nodes, body aches, joint swelling, chest pain, shortness of breath, mood changes. POSITIVE muscle aches  Objective  Blood pressure 130/82, pulse 76, height 5\' 10"  (1.778 m), weight 218 lb (98.9 kg), SpO2 94 %.   General: No apparent distress alert and oriented x3 mood and affect normal, dressed appropriately.  HEENT: Pupils equal, extraocular movements intact  Respiratory: Patient's speak in full sentences and does not appear short of breath  Cardiovascular: Trace lower extremity edema, non tender, no erythema  Right shoulder exam shows good range of motion.  Seems to be 4+ out of 5 strength of the rotator cuff.  Negative empty can.  Positive impingement, positive crossover test.  Neurovascular intact down the  arm. Contralateral arm also has some mild impingement but otherwise doing relatively well. Left knee exam does show some arthritic changes.  Some instability with valgus and varus force.  Fullness noted of the popliteal area on the left knee.  No calf pain.  No swelling of the calf significantly.  Limited musculoskeletal ultrasound was performed and interpreted by  Limited ultrasound of patient's right shoulder shows the patient has significant hypoechoic changes within the bicep tendon sheath noted.  Questionable anterior labral pathology also noted.  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  Mild underlying glenohumeral arthritis noted as well. Impression: Questionable rotator cuff tear  Procedure: Real-time Ultrasound Guided Injection of left Baker's cyst Device: GE Logiq Q7 Ultrasound guided injection is preferred based studies that show increased duration, increased effect, greater accuracy, decreased procedural pain, increased response rate, and decreased cost with ultrasound guided versus  blind injection.  Verbal informed consent obtained.  Time-out conducted.  Noted no overlying erythema, induration, or other signs of local infection.  Skin prepped in a sterile fashion.  Local anesthesia: Topical Ethyl chloride.  With sterile technique and under real time ultrasound guidance: With a 22-gauge 2 inch needle patient was injected with 4 cc of 0.5% Marcaine and aspirated 30 cc of straw-colored fluid then injected 1 cc of Kenalog 40 mg/dL. This was from a posterior l approach.  Completed without difficulty  Pain immediately resolved suggesting accurate placement of the medication.  Advised to call if fevers/chills, erythema, induration, drainage, or persistent bleeding.  Impression: Technically successful ultrasound guided injection.   Impression and Recommendations:     The above  documentation has been reviewed and is accurate and complete Judi Saa, DO

## 2020-02-10 ENCOUNTER — Encounter: Payer: Self-pay | Admitting: Family Medicine

## 2020-02-10 ENCOUNTER — Other Ambulatory Visit: Payer: Self-pay | Admitting: Family Medicine

## 2020-02-10 ENCOUNTER — Other Ambulatory Visit: Payer: Self-pay

## 2020-02-10 ENCOUNTER — Ambulatory Visit (INDEPENDENT_AMBULATORY_CARE_PROVIDER_SITE_OTHER): Payer: BC Managed Care – PPO | Admitting: Family Medicine

## 2020-02-10 ENCOUNTER — Ambulatory Visit: Payer: Self-pay

## 2020-02-10 ENCOUNTER — Ambulatory Visit (INDEPENDENT_AMBULATORY_CARE_PROVIDER_SITE_OTHER): Payer: BC Managed Care – PPO

## 2020-02-10 VITALS — BP 130/82 | HR 76 | Ht 70.0 in | Wt 218.0 lb

## 2020-02-10 DIAGNOSIS — M7122 Synovial cyst of popliteal space [Baker], left knee: Secondary | ICD-10-CM

## 2020-02-10 DIAGNOSIS — M75111 Incomplete rotator cuff tear or rupture of right shoulder, not specified as traumatic: Secondary | ICD-10-CM

## 2020-02-10 DIAGNOSIS — G8929 Other chronic pain: Secondary | ICD-10-CM

## 2020-02-10 DIAGNOSIS — M25562 Pain in left knee: Secondary | ICD-10-CM | POA: Diagnosis not present

## 2020-02-10 DIAGNOSIS — M25561 Pain in right knee: Secondary | ICD-10-CM | POA: Diagnosis not present

## 2020-02-10 DIAGNOSIS — M75101 Unspecified rotator cuff tear or rupture of right shoulder, not specified as traumatic: Secondary | ICD-10-CM | POA: Insufficient documentation

## 2020-02-10 DIAGNOSIS — M25511 Pain in right shoulder: Secondary | ICD-10-CM

## 2020-02-10 NOTE — Patient Instructions (Signed)
Xray today Drained knee today Exercises 3x a week Ice 20 min, 2x a day See me again in 6 weeks

## 2020-02-10 NOTE — Assessment & Plan Note (Signed)
Patient based on history has not incomplete tear that is nontraumatic.  Does not remember any true injury.  Patient does have some mild underlying arthritic changes that could be contributing as well.  I would like patient to try conservative therapy.  Patient does have hypoechoic changes of the bicep tendon discussed the potential compression sleeve.  Discussed topical anti-inflammatories.  Patient will try this.  Follow-up in 4 to 6 weeks if continued pain consider injection as well as physical therapy.

## 2020-02-10 NOTE — Assessment & Plan Note (Signed)
Aspiration today  Likely underlying arthritis.  Discussed HEP discussed compression Patient could be a candidate for possible viscosupplementation for this knee as well.  Follow-up with me again 4 to 6 weeks

## 2020-03-04 ENCOUNTER — Encounter: Payer: Self-pay | Admitting: Internal Medicine

## 2020-03-04 ENCOUNTER — Ambulatory Visit (INDEPENDENT_AMBULATORY_CARE_PROVIDER_SITE_OTHER): Payer: BC Managed Care – PPO | Admitting: Internal Medicine

## 2020-03-04 ENCOUNTER — Other Ambulatory Visit: Payer: Self-pay

## 2020-03-04 VITALS — BP 128/78 | HR 80 | Temp 98.1°F | Resp 18 | Ht 70.0 in | Wt 215.0 lb

## 2020-03-04 DIAGNOSIS — E6609 Other obesity due to excess calories: Secondary | ICD-10-CM

## 2020-03-04 DIAGNOSIS — R7301 Impaired fasting glucose: Secondary | ICD-10-CM

## 2020-03-04 DIAGNOSIS — Z0000 Encounter for general adult medical examination without abnormal findings: Secondary | ICD-10-CM

## 2020-03-04 DIAGNOSIS — I10 Essential (primary) hypertension: Secondary | ICD-10-CM | POA: Diagnosis not present

## 2020-03-04 DIAGNOSIS — Z6830 Body mass index (BMI) 30.0-30.9, adult: Secondary | ICD-10-CM

## 2020-03-04 LAB — COMPREHENSIVE METABOLIC PANEL
ALT: 22 U/L (ref 0–35)
AST: 29 U/L (ref 0–37)
Albumin: 3.8 g/dL (ref 3.5–5.2)
Alkaline Phosphatase: 51 U/L (ref 39–117)
BUN: 11 mg/dL (ref 6–23)
CO2: 29 mEq/L (ref 19–32)
Calcium: 9.3 mg/dL (ref 8.4–10.5)
Chloride: 103 mEq/L (ref 96–112)
Creatinine, Ser: 0.88 mg/dL (ref 0.40–1.20)
GFR: 70.67 mL/min (ref >60.00)
Glucose, Bld: 96 mg/dL (ref 70–99)
Potassium: 3.5 mEq/L (ref 3.5–5.1)
Sodium: 138 mEq/L (ref 135–145)
Total Bilirubin: 0.3 mg/dL (ref 0.2–1.2)
Total Protein: 7 g/dL (ref 6.0–8.3)

## 2020-03-04 LAB — CBC
HCT: 38.5 % (ref 36.0–46.0)
Hemoglobin: 12.6 g/dL (ref 12.0–15.0)
MCHC: 32.8 g/dL (ref 30.0–36.0)
MCV: 87.4 fl (ref 78.0–100.0)
Platelets: 351 10*3/uL (ref 150.0–400.0)
RBC: 4.4 Mil/uL (ref 3.87–5.11)
RDW: 13.4 % (ref 11.5–15.5)
WBC: 7.1 10*3/uL (ref 4.0–10.5)

## 2020-03-04 LAB — LIPID PANEL
Cholesterol: 265 mg/dL — ABNORMAL HIGH (ref 0–200)
HDL: 86.7 mg/dL (ref >39.00)
LDL Cholesterol: 154 mg/dL — ABNORMAL HIGH (ref 0–99)
NonHDL: 177.98
Total CHOL/HDL Ratio: 3
Triglycerides: 120 mg/dL (ref 0.0–149.0)
VLDL: 24 mg/dL (ref 0.0–40.0)

## 2020-03-04 LAB — HEMOGLOBIN A1C: Hgb A1c MFr Bld: 6.2 % (ref 4.6–6.5)

## 2020-03-04 LAB — MAGNESIUM: Magnesium: 2.1 mg/dL (ref 1.5–2.5)

## 2020-03-04 NOTE — Assessment & Plan Note (Signed)
BP at goal on amlodipine 10 mg daily and losartan/hctz 100/12.5 mg. Checking CMP and magnesium, adjust as needed.

## 2020-03-04 NOTE — Patient Instructions (Signed)
Health Maintenance, Female Adopting a healthy lifestyle and getting preventive care are important in promoting health and wellness. Ask your health care provider about:  The right schedule for you to have regular tests and exams.  Things you can do on your own to prevent diseases and keep yourself healthy. What should I know about diet, weight, and exercise? Eat a healthy diet  Eat a diet that includes plenty of vegetables, fruits, low-fat dairy products, and lean protein.  Do not eat a lot of foods that are high in solid fats, added sugars, or sodium.   Maintain a healthy weight Body mass index (BMI) is used to identify weight problems. It estimates body fat based on height and weight. Your health care provider can help determine your BMI and help you achieve or maintain a healthy weight. Get regular exercise Get regular exercise. This is one of the most important things you can do for your health. Most adults should:  Exercise for at least 150 minutes each week. The exercise should increase your heart rate and make you sweat (moderate-intensity exercise).  Do strengthening exercises at least twice a week. This is in addition to the moderate-intensity exercise.  Spend less time sitting. Even light physical activity can be beneficial. Watch cholesterol and blood lipids Have your blood tested for lipids and cholesterol at 62 years of age, then have this test every 5 years. Have your cholesterol levels checked more often if:  Your lipid or cholesterol levels are high.  You are older than 62 years of age.  You are at high risk for heart disease. What should I know about cancer screening? Depending on your health history and family history, you may need to have cancer screening at various ages. This may include screening for:  Breast cancer.  Cervical cancer.  Colorectal cancer.  Skin cancer.  Lung cancer. What should I know about heart disease, diabetes, and high blood  pressure? Blood pressure and heart disease  High blood pressure causes heart disease and increases the risk of stroke. This is more likely to develop in people who have high blood pressure readings, are of African descent, or are overweight.  Have your blood pressure checked: ? Every 3-5 years if you are 18-39 years of age. ? Every year if you are 40 years old or older. Diabetes Have regular diabetes screenings. This checks your fasting blood sugar level. Have the screening done:  Once every three years after age 40 if you are at a normal weight and have a low risk for diabetes.  More often and at a younger age if you are overweight or have a high risk for diabetes. What should I know about preventing infection? Hepatitis B If you have a higher risk for hepatitis B, you should be screened for this virus. Talk with your health care provider to find out if you are at risk for hepatitis B infection. Hepatitis C Testing is recommended for:  Everyone born from 1945 through 1965.  Anyone with known risk factors for hepatitis C. Sexually transmitted infections (STIs)  Get screened for STIs, including gonorrhea and chlamydia, if: ? You are sexually active and are younger than 62 years of age. ? You are older than 62 years of age and your health care provider tells you that you are at risk for this type of infection. ? Your sexual activity has changed since you were last screened, and you are at increased risk for chlamydia or gonorrhea. Ask your health care provider   if you are at risk.  Ask your health care provider about whether you are at high risk for HIV. Your health care provider may recommend a prescription medicine to help prevent HIV infection. If you choose to take medicine to prevent HIV, you should first get tested for HIV. You should then be tested every 3 months for as long as you are taking the medicine. Pregnancy  If you are about to stop having your period (premenopausal) and  you may become pregnant, seek counseling before you get pregnant.  Take 400 to 800 micrograms (mcg) of folic acid every day if you become pregnant.  Ask for birth control (contraception) if you want to prevent pregnancy. Osteoporosis and menopause Osteoporosis is a disease in which the bones lose minerals and strength with aging. This can result in bone fractures. If you are 65 years old or older, or if you are at risk for osteoporosis and fractures, ask your health care provider if you should:  Be screened for bone loss.  Take a calcium or vitamin D supplement to lower your risk of fractures.  Be given hormone replacement therapy (HRT) to treat symptoms of menopause. Follow these instructions at home: Lifestyle  Do not use any products that contain nicotine or tobacco, such as cigarettes, e-cigarettes, and chewing tobacco. If you need help quitting, ask your health care provider.  Do not use street drugs.  Do not share needles.  Ask your health care provider for help if you need support or information about quitting drugs. Alcohol use  Do not drink alcohol if: ? Your health care provider tells you not to drink. ? You are pregnant, may be pregnant, or are planning to become pregnant.  If you drink alcohol: ? Limit how much you use to 0-1 drink a day. ? Limit intake if you are breastfeeding.  Be aware of how much alcohol is in your drink. In the [place] one drink equals one 12 oz bottle of beer (355 mL), one 5 oz glass of wine (148 mL), or one 1 oz glass of hard liquor (44 mL). General instructions  Schedule regular health, dental, and eye exams.  Stay current with your vaccines.  Tell your health care provider if: ? You often feel depressed. ? You have ever been abused or do not feel safe at home. Summary  Adopting a healthy lifestyle and getting preventive care are important in promoting health and wellness.  Follow your health care provider's instructions about healthy  diet, exercising, and getting tested or screened for diseases.  Follow your health care provider's instructions on monitoring your cholesterol and blood pressure. This information is not intended to replace advice given to you by your health care provider. Make sure you discuss any questions you have with your health care provider. Document Revised: 01/08/2018 Document Reviewed: 01/08/2018 Elsevier Patient Education  2021 Elsevier Inc.  

## 2020-03-04 NOTE — Assessment & Plan Note (Signed)
Checking HgA1c and adjust as needed. Previous 6.3

## 2020-03-04 NOTE — Assessment & Plan Note (Signed)
Weight stable and we discussed diet, she is walking 68127 steps per day.

## 2020-03-04 NOTE — Progress Notes (Signed)
   Subjective:   Patient ID: Kirsten Campbell, female    DOB: [birth year], 60 y.o.   MRN: [id number]  HPI The patient is a 60 YO female coming in for physical.   PMH, FMH, social history reviewed and updated  Review of Systems  Constitutional: Negative.   HENT: Negative.   Eyes: Negative.   Respiratory: Negative for cough, chest tightness and shortness of breath.   Cardiovascular: Negative for chest pain, palpitations and leg swelling.  Gastrointestinal: Negative for abdominal distention, abdominal pain, constipation, diarrhea, nausea and vomiting.  Musculoskeletal: Negative.   Skin: Negative.   Neurological: Negative.   Psychiatric/Behavioral: Negative.     Objective:  Physical Exam Constitutional:      Appearance: She is well-developed and well-nourished. She is obese.  HENT:     Head: Normocephalic and atraumatic.  Eyes:     Extraocular Movements: EOM normal.  Cardiovascular:     Rate and Rhythm: Normal rate and regular rhythm.  Pulmonary:     Effort: Pulmonary effort is normal. No respiratory distress.     Breath sounds: Normal breath sounds. No wheezing or rales.  Abdominal:     General: Bowel sounds are normal. There is no distension.     Palpations: Abdomen is soft.     Tenderness: There is no abdominal tenderness. There is no rebound.  Musculoskeletal:        General: No edema.     Cervical back: Normal range of motion.  Skin:    General: Skin is warm and dry.  Neurological:     Mental Status: She is alert and oriented to person, place, and time.     Coordination: Coordination normal.  Psychiatric:        Mood and Affect: Mood and affect normal.    Vitals:   03/04/20 0802  BP: 128/78  Pulse: 80  Resp: 18  Temp: 98.1 F (36.7 C)  TempSrc: Oral  SpO2: 98%  Weight: 215 lb (97.5 kg)  Height: 5\' 10"  (1.778 m)    This visit occurred during the SARS-CoV-2 public health emergency.  Safety protocols were in place, including screening questions prior to the  visit, additional usage of staff PPE, and extensive cleaning of exam room while observing appropriate contact time as indicated for disinfecting solutions.   Assessment & Plan:

## 2020-03-04 NOTE — Assessment & Plan Note (Signed)
Flu shot up to date. Covid-19 up to date including booster. Shingrix complete. Tetanus up to date. Colonoscopy up to date. Mammogram up to date, pap smear not indicated. Counseled about sun safety and mole surveillance. Counseled about the dangers of distracted driving. Given 10 year screening recommendations.

## 2020-03-07 ENCOUNTER — Encounter: Payer: Self-pay | Admitting: Internal Medicine

## 2020-03-08 ENCOUNTER — Other Ambulatory Visit: Payer: Self-pay | Admitting: Internal Medicine

## 2020-03-08 MED ORDER — PRAVASTATIN SODIUM 20 MG PO TABS: 20.0000 mg | ORAL_TABLET | Freq: Every day | ORAL | 3 refills | Status: DC

## 2020-03-25 ENCOUNTER — Other Ambulatory Visit: Payer: Self-pay | Admitting: Internal Medicine

## 2020-03-28 MED FILL — ESTRADIOL 2 MG TABS: 2 | 90 days supply | Qty: 180 | Fill #3

## 2020-03-29 NOTE — Progress Notes (Signed)
[facility] [address] [place] [address] Phone: [phone] Subjective:   I Kirsten Campbell am serving as a Neurosurgeon for Dr. Antoine Primas.  This visit occurred during the SARS-CoV-2 public health emergency.  Safety protocols were in place, including screening questions prior to the visit, additional usage of staff PPE, and extensive cleaning of exam room while observing appropriate contact time as indicated for disinfecting solutions.   I'm seeing this patient by the request  of:  Kirsten Broker, MD  CC: Left knee pain follow-up  [id number]   02/10/2020 Patient based on history has not incomplete tear that is nontraumatic.  Does not remember any true injury.  Patient does have some mild underlying arthritic changes that could be contributing as well.  I would like patient to try conservative therapy.  Patient does have hypoechoic changes of the bicep tendon discussed the potential compression sleeve.  Discussed topical anti-inflammatories.  Patient will try this.  Follow-up in 4 to 6 weeks if continued pain consider injection as well as physical therapy.  Aspiration today  Likely underlying arthritis.  Discussed HEP discussed compression Patient could be a candidate for possible viscosupplementation for this knee as well.  Follow-up with me again 4 to 6 weeks   Update 03/29/2020 Kirsten Campbell is a 59 y.o. female coming in with complaint of left knee pain. Patient states the knee is still painful. Sitting for long periods of time and then getting up is painful. Popping with exercise.  Patient states that it is aching at night.  More of a dull throbbing aching.  Does not know if it has swelling again. Patient states that her right shoulder seems to be completely better at this time.  No trouble with that.   Patient did have x-rays showing moderate osteoarthritic changes of the patellofemoral compartment mild osteoarthritic changes of the medial  and lateral compartments of the knee.  Past Medical History:  Diagnosis Date  . Asthma   . Chicken pox   . Heart murmur   . Hyperlipidemia   . Hypertension    Past Surgical History:  Procedure Laterality Date  . ABDOMINAL HYSTERECTOMY  1995   Social History   Socioeconomic History  . Marital status: Married    Spouse name: Not on file  . Number of children: Not on file  . Years of education: Not on file  . Highest education level: Not on file  Occupational History  . Not on file  Tobacco Use  . Smoking status: Never Smoker  . Smokeless tobacco: Never Used  Substance and Sexual Activity  . Alcohol use: No  . Drug use: No  . Sexual activity: Not on file  Other Topics Concern  . Not on file  Social History Narrative  . Not on file   Social Determinants of Health   Financial Resource Strain: Not on file  Food Insecurity: Not on file  Transportation Needs: Not on file  Physical Activity: Not on file  Stress: Not on file  Social Connections: Not on file   Allergies  Allergen Reactions  . Penicillins Hives   Family History  Problem Relation Age of Onset  . Cancer Mother        Breast  . Hypertension Mother   . Breast cancer Mother 22  . Hypertension Father   . Hypertension Sister   . Breast cancer Sister 43  . Hypertension Brother   . Heart murmur Child     Current  Outpatient Medications (Endocrine & Metabolic):  .  estradiol (ESTRACE) 2 MG tablet, Take 2 mg by mouth daily.  Current Outpatient Medications (Cardiovascular):  .  amLODipine (NORVASC) 10 MG tablet, TAKE 1 TABLET(10 MG) BY MOUTH DAILY .  fenofibrate 160 MG tablet, Take 160 mg by mouth daily. Reported on 02/17/2015 .  losartan-hydrochlorothiazide (HYZAAR) 100-12.5 MG tablet, TAKE 1 TABLET BY MOUTH EVERY DAY .  pravastatin (PRAVACHOL) 20 MG tablet, Take 1 tablet (20 mg total) by mouth daily. .  verapamil (CALAN-SR) 180 MG CR tablet, TAKE 1 TABLET(180 MG) BY MOUTH DAILY   Current Outpatient  Medications (Analgesics):  .  acetaminophen (TYLENOL) 500 MG tablet, Take 500 mg by mouth 3 (three) times daily. Marland Kitchen  aspirin 81 MG tablet, Take 81 mg by mouth daily.  Current Outpatient Medications (Hematological):  Marland Kitchen  Ferrous Sulfate (IRON) 325 (65 Fe) MG TABS, Take by mouth. Take one by mouth three times a week.  Current Outpatient Medications (Other):  .  cyclobenzaprine (FLEXERIL) 10 MG tablet, TAKE 1 TABLET(10 MG) BY MOUTH TWICE DAILY .  gabapentin (NEURONTIN) 400 MG capsule, TAKE 1 CAPSULE BY MOUTH DAILY AT BEDTIME .  Multiple Vitamin (MULTIVITAMIN) tablet, Take 1 tablet by mouth daily. .  TURMERIC PO, Take by mouth. .  Vitamin D, Cholecalciferol, 1000 units TABS, Take 2,000 Units by mouth daily.   Reviewed prior external information including notes and imaging from  primary care provider As well as notes that were available from care everywhere and other healthcare systems.  Past medical history, social, surgical and family history all reviewed in electronic medical record.  No pertanent information unless stated regarding to the chief complaint.   Review of Systems:  No headache, visual changes, nausea, vomiting, diarrhea, constipation, dizziness, abdominal pain, skin rash, fevers, chills, night sweats, weight loss, swollen lymph nodes, body aches, joint swelling, chest pain, shortness of breath, mood changes. POSITIVE muscle aches  Objective  Blood pressure (!) 150/82, pulse 69, height 5\' 10"  (1.778 m), weight 215 lb (97.5 kg), SpO2 95 %.   General: No apparent distress alert and oriented x3 mood and affect normal, dressed appropriately.  HEENT: Pupils equal, extraocular movements intact  Respiratory: Patient's speak in full sentences and does not appear short of breath  Cardiovascular: 1+ lower extremity edema, nr, no erythema patient does have some varicose veins noted of the left lower extremity. Gait antalgic MSK:    Left knee exam shows the patient does have pain more in  the popliteal area.  Patient does have swelling noted of the patellofemoral joint.  Patient does have limited flexion lacking the last 10 degrees.  Patient does have crepitus noted.  Some mild instability with valgus and varus force.  Positive patellar grind test noted.    Impression and Recommendations:     The above documentation has been reviewed and is accurate and complete , DO

## 2020-03-30 ENCOUNTER — Ambulatory Visit: Payer: BC Managed Care – PPO | Admitting: Family Medicine

## 2020-03-30 ENCOUNTER — Telehealth: Payer: Self-pay

## 2020-03-30 ENCOUNTER — Other Ambulatory Visit: Payer: Self-pay

## 2020-03-30 ENCOUNTER — Encounter: Payer: Self-pay | Admitting: Family Medicine

## 2020-03-30 VITALS — BP 150/82 | HR 69 | Ht 70.0 in | Wt 215.0 lb

## 2020-03-30 DIAGNOSIS — M79605 Pain in left leg: Secondary | ICD-10-CM

## 2020-03-30 DIAGNOSIS — M25562 Pain in left knee: Secondary | ICD-10-CM

## 2020-03-30 DIAGNOSIS — M1712 Unilateral primary osteoarthritis, left knee: Secondary | ICD-10-CM

## 2020-03-30 DIAGNOSIS — G8929 Other chronic pain: Secondary | ICD-10-CM

## 2020-03-30 NOTE — Assessment & Plan Note (Signed)
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  Especially of the patellofemoral joint.  Patient does have a patellofemoral swelling noted to ambulate today.  We discussed potential injection again but would not want to use steroids.  We will get patient approved for viscosupplementation.  Due to patient having more pain on the posterior area in the popliteal area will get Doppler but I think it is highly unlikely but patient does have some mild increase in varicose vein.  Patient does state that the initial injection was helpful initially.  We discussed potential bracing of the knee.  Discussed worsening pain seek medical attention immediately.  We did discuss the possibility of needing a knee replacement in the future.  Patient of course wants to avoid that.  Patient declined physical therapy.  If we can get approval for the viscosupplementation will have patient come back in the near future to have the injection.

## 2020-03-30 NOTE — Telephone Encounter (Signed)
Notified patient that Dr. Katrinka Blazing changed his mind and would like to order MRI of left knee. Patient voices understanding.

## 2020-03-30 NOTE — Patient Instructions (Addendum)
Good to see you Doppler left leg We will get you approved for the gel injection Ice the knee before bed Consider bracing [facility]  ([facility] [employer] in [facility]) [address], [address] [place], [place] [address] [phone] Scheduled at 9am

## 2020-03-31 ENCOUNTER — Other Ambulatory Visit: Payer: Self-pay

## 2020-03-31 ENCOUNTER — Ambulatory Visit (HOSPITAL_COMMUNITY)
Admission: RE | Admit: 2020-03-31 | Discharge: 2020-03-31 | Disposition: A | Discharge status: Home / Self Care | Payer: BC Managed Care – PPO | Source: Ambulatory Visit | Attending: Cardiovascular Disease | Admitting: Cardiovascular Disease

## 2020-03-31 DIAGNOSIS — M79605 Pain in left leg: Secondary | ICD-10-CM | POA: Diagnosis not present

## 2020-04-01 ENCOUNTER — Encounter: Payer: Self-pay | Admitting: Family Medicine

## 2020-04-08 ENCOUNTER — Telehealth: Payer: Self-pay | Admitting: Internal Medicine

## 2020-04-08 NOTE — Telephone Encounter (Signed)
See below

## 2020-04-08 NOTE — Telephone Encounter (Signed)
Patient said that she would tr something else. She did not want to do the virtual at this time.

## 2020-04-08 NOTE — Telephone Encounter (Signed)
Please schedule virtual with Saturday clinic

## 2020-04-08 NOTE — Telephone Encounter (Signed)
   Patient seeking advice for vomiting , she thinks she ate something that wasn't prepared properly.  Seeking advice

## 2020-04-14 ENCOUNTER — Other Ambulatory Visit: Payer: Self-pay

## 2020-04-14 ENCOUNTER — Ambulatory Visit
Admission: RE | Admit: 2020-04-14 | Discharge: 2020-04-14 | Disposition: A | Discharge status: Home / Self Care | Payer: BC Managed Care – PPO | Source: Ambulatory Visit | Attending: Family Medicine | Admitting: Family Medicine

## 2020-04-14 DIAGNOSIS — G8929 Other chronic pain: Secondary | ICD-10-CM

## 2020-04-14 DIAGNOSIS — M25562 Pain in left knee: Secondary | ICD-10-CM

## 2020-04-17 ENCOUNTER — Encounter: Payer: Self-pay | Admitting: Family Medicine

## 2020-04-18 ENCOUNTER — Other Ambulatory Visit: Payer: Self-pay

## 2020-04-18 DIAGNOSIS — M1712 Unilateral primary osteoarthritis, left knee: Secondary | ICD-10-CM

## 2020-04-22 ENCOUNTER — Other Ambulatory Visit: Payer: Self-pay | Admitting: Internal Medicine

## 2020-05-10 ENCOUNTER — Telehealth: Payer: Self-pay | Admitting: Family Medicine

## 2020-05-10 NOTE — Telephone Encounter (Signed)
unfortunately that can mess with some of the medicine they will use for surgery. If surgery is soon I would not want to rock the boat or add anythign that could delay the surgery, may want to ask primary if they have any other good idea

## 2020-05-10 NOTE — Telephone Encounter (Signed)
Spoke with patient and recommended she contact Dr. Luiz Blare to make sure she can take medication prior to surgery. She is going to do so and then call us or Dr. Okey Dupre back.

## 2020-05-10 NOTE — Telephone Encounter (Signed)
Patient called to let Dr Katrinka Blazing know that she is scheduled for upcoming knee surgery with Dr Luiz Blare. She has had a lot of anxiety, trouble sleeping and no appetite. She said that this is most likely due to being nervous before her surgery but she said that "there is a lot of medicine that I cannot take because I am getting ready to have surgery". She asked if Dr Katrinka Blazing would be willing to prescribe something to help with her anxiety? (she said that he did this before another surgery she had in the past).  She also asked if Dr Katrinka Blazing thought it was okay for her to take her muscle relaxer? I told her that might would be a question for her surgeon but I would check with Dr Katrinka Blazing.  Please advise.

## 2020-06-15 ENCOUNTER — Other Ambulatory Visit: Payer: Self-pay | Admitting: Obstetrics & Gynecology

## 2020-06-15 DIAGNOSIS — N6310 Unspecified lump in the right breast, unspecified quadrant: Secondary | ICD-10-CM

## 2020-06-23 ENCOUNTER — Other Ambulatory Visit (HOSPITAL_COMMUNITY): Payer: Self-pay

## 2020-06-23 MED ORDER — ESTRADIOL 2 MG PO TABS
4.0000 mg | ORAL_TABLET | Freq: Every day | ORAL | 3 refills | Status: DC
  Filled 2020-06-23: qty 180, 90d supply, fill #0
  Filled 2020-09-29: qty 180, 90d supply, fill #1
  Filled 2020-12-30: qty 180, 90d supply, fill #2
  Filled 2021-03-31: qty 180, 90d supply, fill #3

## 2020-06-24 ENCOUNTER — Other Ambulatory Visit (HOSPITAL_COMMUNITY): Payer: Self-pay

## 2020-06-30 ENCOUNTER — Ambulatory Visit
Admission: RE | Admit: 2020-06-30 | Discharge: 2020-06-30 | Disposition: A | Discharge status: Home / Self Care | Payer: BC Managed Care – PPO | Source: Ambulatory Visit | Attending: Obstetrics & Gynecology | Admitting: Obstetrics & Gynecology

## 2020-06-30 ENCOUNTER — Other Ambulatory Visit: Payer: Self-pay

## 2020-06-30 DIAGNOSIS — N6310 Unspecified lump in the right breast, unspecified quadrant: Secondary | ICD-10-CM

## 2020-08-09 ENCOUNTER — Encounter: Payer: Self-pay | Admitting: Internal Medicine

## 2020-08-09 ENCOUNTER — Telehealth (INDEPENDENT_AMBULATORY_CARE_PROVIDER_SITE_OTHER): Payer: BC Managed Care – PPO | Admitting: Internal Medicine

## 2020-08-09 DIAGNOSIS — R059 Cough, unspecified: Secondary | ICD-10-CM

## 2020-08-09 MED ORDER — BENZONATATE 200 MG PO CAPS: 200.0000 mg | ORAL_CAPSULE | Freq: Three times a day (TID) | ORAL | 0 refills | Status: DC | PRN

## 2020-08-09 MED ORDER — AZITHROMYCIN 250 MG PO TABS: ORAL_TABLET | ORAL | 0 refills | Status: AC

## 2020-08-09 NOTE — Assessment & Plan Note (Signed)
Concern for CAP given newly productive cough. Rx azithromycin due to pcn allergy and tessalon perles.

## 2020-08-09 NOTE — Progress Notes (Signed)
Virtual Visit via Video Note  I connected with Kirsten Campbell on 08/09/20 at  8:00 AM EDT by a video enabled telemedicine application and verified that I am speaking with the correct person using two identifiers.  The patient and the provider were at separate locations throughout the entire encounter. Patient location: home, Provider location: work   I discussed the limitations of evaluation and management by telemedicine and the availability of in person appointments. The patient expressed understanding and agreed to proceed. The patient and the provider were the only parties present for the visit unless noted in HPI below.  History of Present Illness: The patient is a 60 y.o. female with visit for cough. Started about a week or two ago. Has had multiple negative covid-19 tests. Has had 3 covid-19 vaccinations. Denies fevers or chills. Se is having cough which is newly productive in the last few days. Overall it is not improving. Has tried otc cough and cold medicine without relief  Observations/Objective: Appearance: normal, breathing appears normal, minimal coughing during visit, casual grooming, abdomen does not appear distended, throat not well visualized, A and O times 3  Assessment and Plan: See problem oriented charting  Follow Up Instructions: rx azithromycin and tessalon perles  I discussed the assessment and treatment plan with the patient. The patient was provided an opportunity to ask questions and all were answered. The patient agreed with the plan and demonstrated an understanding of the instructions.   The patient was advised to call back or seek an in-person evaluation if the symptoms worsen or if the condition fails to improve as anticipated.  Myrlene Broker, MD

## 2020-08-31 ENCOUNTER — Ambulatory Visit (INDEPENDENT_AMBULATORY_CARE_PROVIDER_SITE_OTHER): Payer: BC Managed Care – PPO | Admitting: Internal Medicine

## 2020-08-31 ENCOUNTER — Ambulatory Visit (INDEPENDENT_AMBULATORY_CARE_PROVIDER_SITE_OTHER): Payer: BC Managed Care – PPO

## 2020-08-31 ENCOUNTER — Other Ambulatory Visit: Payer: Self-pay

## 2020-08-31 ENCOUNTER — Encounter: Payer: Self-pay | Admitting: Internal Medicine

## 2020-08-31 VITALS — BP 128/88 | HR 75 | Temp 97.8°F | Resp 18 | Ht 70.0 in | Wt 210.0 lb

## 2020-08-31 DIAGNOSIS — R059 Cough, unspecified: Secondary | ICD-10-CM

## 2020-08-31 MED ORDER — PREDNISONE 20 MG PO TABS: 40.0000 mg | ORAL_TABLET | Freq: Every day | ORAL | 0 refills | Status: AC

## 2020-08-31 NOTE — Assessment & Plan Note (Signed)
Suspect post-viral but getting CXR to rule out occult pneumonia. Rx prednisone 3 day course to help resolve some lingering inflammation.

## 2020-08-31 NOTE — Patient Instructions (Signed)
We will check the chest x-ray today and have sent in prednisone to take 2 pills daily for 3 days to help reduce and eliminate the cough.

## 2020-08-31 NOTE — Progress Notes (Signed)
   Subjective:   Patient ID: Kirsten Campbell, female    DOB: 11-Jul-1960, 60 y.o.   MRN: [id number]  HPI The patient is a 60 YO female coming in for follow up cough. Given cough suppressant and antibiotic back 2-3 weeks ago. This did help with most of her symptoms except cough which has lingered. She is still having coughing episodes. Denies fevers or chills. Denies congestion. Denies SOB and working without problems. The coughing spells are decreasing in frequency but can last 5-10 minutes. Sometimes nauseous after coughing so much.   Review of Systems  Constitutional: Negative.   HENT: Negative.    Eyes: Negative.   Respiratory:  Positive for cough. Negative for chest tightness and shortness of breath.   Cardiovascular:  Negative for chest pain, palpitations and leg swelling.  Gastrointestinal:  Negative for abdominal distention, abdominal pain, constipation, diarrhea, nausea and vomiting.  Musculoskeletal: Negative.   Skin: Negative.   Neurological: Negative.   Psychiatric/Behavioral: Negative.     Objective:  Physical Exam Constitutional:      Appearance: She is well-developed.  HENT:     Head: Normocephalic and atraumatic.  Cardiovascular:     Rate and Rhythm: Normal rate and regular rhythm.  Pulmonary:     Effort: Pulmonary effort is normal. No respiratory distress.     Breath sounds: Normal breath sounds. No wheezing or rales.  Abdominal:     General: Bowel sounds are normal. There is no distension.     Palpations: Abdomen is soft.     Tenderness: There is no abdominal tenderness. There is no rebound.  Musculoskeletal:     Cervical back: Normal range of motion.  Skin:    General: Skin is warm and dry.  Neurological:     Mental Status: She is alert and oriented to person, place, and time.     Coordination: Coordination normal.    Vitals:   08/31/20 0839  BP: 128/88  Pulse: 75  Resp: 18  Temp: 97.8 F (36.6 C)  TempSrc: Oral  SpO2: 97%  Weight: 210 lb (95.3 kg)   Height: 5\' 10"  (1.778 m)    This visit occurred during the SARS-CoV-2 public health emergency.  Safety protocols were in place, including screening questions prior to the visit, additional usage of staff PPE, and extensive cleaning of exam room while observing appropriate contact time as indicated for disinfecting solutions.   Assessment & Plan:

## 2020-09-14 ENCOUNTER — Other Ambulatory Visit: Payer: Self-pay | Admitting: Obstetrics & Gynecology

## 2020-09-14 DIAGNOSIS — Z1231 Encounter for screening mammogram for malignant neoplasm of breast: Secondary | ICD-10-CM

## 2020-09-29 ENCOUNTER — Other Ambulatory Visit (HOSPITAL_COMMUNITY): Payer: Self-pay

## 2020-10-06 ENCOUNTER — Other Ambulatory Visit: Payer: Self-pay

## 2020-10-06 ENCOUNTER — Encounter: Payer: Self-pay | Admitting: Sports Medicine

## 2020-10-06 ENCOUNTER — Ambulatory Visit: Payer: BC Managed Care – PPO | Admitting: Sports Medicine

## 2020-10-06 VITALS — BP 132/82 | HR 92 | Ht 70.0 in | Wt 208.0 lb

## 2020-10-06 DIAGNOSIS — M7541 Impingement syndrome of right shoulder: Secondary | ICD-10-CM

## 2020-10-06 DIAGNOSIS — M25511 Pain in right shoulder: Secondary | ICD-10-CM | POA: Diagnosis not present

## 2020-10-06 DIAGNOSIS — G8929 Other chronic pain: Secondary | ICD-10-CM | POA: Diagnosis not present

## 2020-10-06 NOTE — Progress Notes (Signed)
Kirsten Campbell D.[facility] [address] [place] [address] Phone: [phone]   Assessment and Plan:     1. Chronic right shoulder pain 2. Impingement syndrome of right shoulder -Acute on chronic flare of right shoulder pain, worsening, initial sports medicine visit for this acute flare - Likely impingement syndrome based on HPI, physical exam - Patient elected for CSI.  Tolerated well per procedure note below - Use ibuprofen/Tylenol as needed for pain control - Start HEP.  Handout provided -No MOI, so no repeat imaging performed today.  Reviewed shoulder x-ray from 02/10/2020 showing glenohumeral and AC osteoarthritis    Procedure: Subacromial Injection Side: Right  Risks explained and consent was given verbally. The site was cleaned with alcohol prep. A steroid injection was performed from posterior approach using 16mL of 1% lidocaine without epinephrine and 99mL of kenalog 40mg /ml. This was well tolerated and resulted in symptomatic relief.  Needle was removed, hemostasis achieved, and post injection instructions were explained.   Pt was advised to call or return to clinic if these symptoms worsen or fail to improve as anticipated.  All pertinent previous records reviewed prior to and during visit.    Follow Up: Follow-up in 4 to 6 weeks for reevaluation.  Could consider repeat imaging versus formal PT at that time.  Also follow-up for medication refill   Subjective:   I, , am serving as a scribe for Dr. Debbe Odea  Chief Complaint: Neck and back pain   HPI: 60 year old Female coming in for back and neck pain  10/06/20 Patient states that she is having right sided neck and back pain that radiates in her right hand. Pain started last Monday. No MOI, describes the pain as throbbing in nature. Patient sees Dr. Tuesday for this pain but it has never been this bad before, usually gets an injection.  Denies weakness in the hands  and upper extremity.  Radiates: yes pain down to right hand  Numbness/tingling: no Weakness:no Aggravates:Laying on right side, reaching behind, laying on stomach with head propped on hands  Treatments tried: Massages, demerol, flexiril, Ice, Heat.   Relevant Historical Information: History of similar pain occurring typically once per year, however this is more intense.  Glenohumeral and AC joint osteoarthritis from x-ray on 02/10/2020.  Shoulder ultrasound showing rotator cuff tear in 01/2020.  Additional pertinent review of systems negative.   Current Outpatient Medications:    acetaminophen (TYLENOL) 500 MG tablet, Take 500 mg by mouth 3 (three) times daily., Disp: , Rfl:    amLODipine (NORVASC) 10 MG tablet, TAKE 1 TABLET(10 MG) BY MOUTH DAILY, Disp: 90 tablet, Rfl: 3   aspirin 81 MG tablet, Take 81 mg by mouth daily., Disp: , Rfl:    benzonatate (TESSALON) 200 MG capsule, Take 1 capsule (200 mg total) by mouth 3 (three) times daily as needed., Disp: 60 capsule, Rfl: 0   cyclobenzaprine (FLEXERIL) 10 MG tablet, TAKE 1 TABLET(10 MG) BY MOUTH TWICE DAILY, Disp: 60 tablet, Rfl: 2   estradiol (ESTRACE) 2 MG tablet, Take 2 mg by mouth daily., Disp: , Rfl:    estradiol (ESTRACE) 2 MG tablet, Take 2 tablets (4 mg total) by mouth daily., Disp: 180 tablet, Rfl: 3   fenofibrate 160 MG tablet, Take 160 mg by mouth daily. Reported on 02/17/2015, Disp: , Rfl:    Ferrous Sulfate (IRON) 325 (65 Fe) MG TABS, Take by mouth. Take one by mouth three times a week., Disp: ,  Rfl:    gabapentin (NEURONTIN) 400 MG capsule, TAKE 1 CAPSULE BY MOUTH DAILY AT BEDTIME, Disp: 90 capsule, Rfl: 3   losartan-hydrochlorothiazide (HYZAAR) 100-12.5 MG tablet, TAKE 1 TABLET BY MOUTH EVERY DAY, Disp: 90 tablet, Rfl: 3   Multiple Vitamin (MULTIVITAMIN) tablet, Take 1 tablet by mouth daily., Disp: , Rfl:    pravastatin (PRAVACHOL) 20 MG tablet, Take 1 tablet (20 mg total) by mouth daily., Disp: 90 tablet, Rfl: 3   TURMERIC PO,  Take by mouth., Disp: , Rfl:    verapamil (CALAN-SR) 180 MG CR tablet, TAKE 1 TABLET(180 MG) BY MOUTH DAILY, Disp: 90 tablet, Rfl: 1   Vitamin D, Cholecalciferol, 1000 units TABS, Take 2,000 Units by mouth daily., Disp: , Rfl:    estradiol (ESTRACE) 2 MG tablet, TAKE 2 TABLETS BY MOUTH EVERY DAY, Disp: 180 tablet, Rfl: 3   Objective:     Vitals:   10/06/20 1352  BP: 132/82  Pulse: 92  SpO2: 92%  Weight: 208 lb (94.3 kg)  Height: 5\' 10"  (1.778 m)      Body mass index is 29.84 kg/m.    Physical Exam:    Gen: Appears well, nad, nontoxic and pleasant Neuro:sensation intact, strength is 5/5 with df/pf/inv/ev, muscle tone wnl Skin: no suspicious lesion or defmority Psych: A&O, appropriate mood and affect  Right Shoulder: no deformity, swelling or muscle wasting No scapular winging FF 180, abd 180, int 0, ext 90 Significantly TTP trapezius.  Moderately TTP posterior, lateral, anterior shoulder musculature NTTP over the [place], clavicle, ac, coracoid, biceps groove, humerus, scap spine, cervical spine Positive neer, hawkings, empty can, subscap liftoff,obriens Negative speeds Negative Spurling's test bilat FROM of neck    Electronically signed by:  D.[facility] 2:35 PM 10/06/20

## 2020-10-06 NOTE — Patient Instructions (Addendum)
Good to see you  Injection in R shoulder today Tylenol as needed for pain  Shoulder exercises given  See me again in 4 week follow up with smith for follow up on shoulder and med refill

## 2020-10-15 ENCOUNTER — Other Ambulatory Visit: Payer: Self-pay | Admitting: Family Medicine

## 2020-10-15 ENCOUNTER — Other Ambulatory Visit: Payer: Self-pay | Admitting: Internal Medicine

## 2020-10-17 ENCOUNTER — Other Ambulatory Visit: Payer: Self-pay | Admitting: Internal Medicine

## 2020-10-20 NOTE — Telephone Encounter (Signed)
Patient calling for status of refill for cyclobenzaprine (FLEXERIL) 10 MG tablet

## 2020-10-24 ENCOUNTER — Ambulatory Visit
Admission: RE | Admit: 2020-10-24 | Discharge: 2020-10-24 | Disposition: A | Discharge status: Home / Self Care | Payer: BC Managed Care – PPO | Source: Ambulatory Visit | Attending: Obstetrics & Gynecology | Admitting: Obstetrics & Gynecology

## 2020-10-24 ENCOUNTER — Other Ambulatory Visit: Payer: Self-pay

## 2020-10-24 DIAGNOSIS — Z1231 Encounter for screening mammogram for malignant neoplasm of breast: Secondary | ICD-10-CM

## 2020-11-03 NOTE — Progress Notes (Signed)
[facility] [address] [place] [address] Phone: [phone] Subjective:   Kirsten Campbell, am serving as a scribe for Dr. Antoine Primas. This visit occurred during the SARS-CoV-2 public health emergency.  Safety protocols were in place, including screening questions prior to the visit, additional usage of staff PPE, and extensive cleaning of exam room while observing appropriate contact time as indicated for disinfecting solutions.   I'm seeing this patient by the request  of:  Myrlene Broker, MD  CC: Right knee pain  [id number]  Kirsten Campbell is a 60 y.o. female coming in with complaint of R shoulder pain. Patient saw Dr.  Jean Rosenthal on 10/06/2020 and was given injection. Patient states shoulder is doing better since injections. Right knee pain medial side. Hurts most in flexion, at night, and when sitting or lying for a long time. Characterized as dull and achy. Never sharp      Past Medical History:  Diagnosis Date   Asthma    Chicken pox    Heart murmur    Hyperlipidemia    Hypertension    Past Surgical History:  Procedure Laterality Date   ABDOMINAL HYSTERECTOMY  1995   Social History   Socioeconomic History   Marital status: Married    Spouse name: Not on file   Number of children: Not on file   Years of education: Not on file   Highest education level: Not on file  Occupational History   Not on file  Tobacco Use   Smoking status: Never   Smokeless tobacco: Never  Substance and Sexual Activity   Alcohol use: No   Drug use: No   Sexual activity: Not on file  Other Topics Concern   Not on file  Social History Narrative   Not on file   Social Determinants of Health   Financial Resource Strain: Not on file  Food Insecurity: Not on file  Transportation Needs: Not on file  Physical Activity: Not on file  Stress: Not on file  Social Connections: Not on file   Allergies  Allergen Reactions   Penicillins Hives    Family History  Problem Relation Age of Onset   Cancer Mother        Breast   Hypertension Mother    Breast cancer Mother 23   Hypertension Father    Hypertension Sister    Breast cancer Sister 56   Hypertension Brother    Heart murmur Child     Current Outpatient Medications (Endocrine & Metabolic):    estradiol (ESTRACE) 2 MG tablet, Take 2 mg by mouth daily.   estradiol (ESTRACE) 2 MG tablet, TAKE 2 TABLETS BY MOUTH EVERY DAY   estradiol (ESTRACE) 2 MG tablet, Take 2 tablets (4 mg total) by mouth daily.  Current Outpatient Medications (Cardiovascular):    amLODipine (NORVASC) 10 MG tablet, TAKE 1 TABLET(10 MG) BY MOUTH DAILY   fenofibrate 160 MG tablet, Take 160 mg by mouth daily. Reported on 02/17/2015   losartan-hydrochlorothiazide (HYZAAR) 100-12.5 MG tablet, TAKE 1 TABLET BY MOUTH EVERY DAY   pravastatin (PRAVACHOL) 20 MG tablet, Take 1 tablet (20 mg total) by mouth daily.   verapamil (CALAN-SR) 180 MG CR tablet, TAKE 1 TABLET(180 MG) BY MOUTH DAILY  Current Outpatient Medications (Respiratory):    benzonatate (TESSALON) 200 MG capsule, Take 1 capsule (200 mg total) by mouth 3 (three) times daily as needed.  Current Outpatient Medications (Analgesics):    traMADol (ULTRAM) 50 MG tablet,  Take 1 tablet (50 mg total) by mouth every 8 (eight) hours as needed for up to 5 days.   acetaminophen (TYLENOL) 500 MG tablet, Take 500 mg by mouth 3 (three) times daily.   aspirin 81 MG tablet, Take 81 mg by mouth daily.  Current Outpatient Medications (Hematological):    Ferrous Sulfate (IRON) 325 (65 Fe) MG TABS, Take by mouth. Take one by mouth three times a week.  Current Outpatient Medications (Other):    cyclobenzaprine (FLEXERIL) 10 MG tablet, TAKE 1 TABLET(10 MG) BY MOUTH TWICE DAILY   gabapentin (NEURONTIN) 400 MG capsule, TAKE 1 CAPSULE BY MOUTH DAILY AT BEDTIME   Multiple Vitamin (MULTIVITAMIN) tablet, Take 1 tablet by mouth daily.   TURMERIC PO, Take by mouth.   Vitamin  D, Cholecalciferol, 1000 units TABS, Take 2,000 Units by mouth daily.   Reviewed prior external information including notes and imaging from  primary care provider As well as notes that were available from care everywhere and other healthcare systems.  Past medical history, social, surgical and family history all reviewed in electronic medical record.  No pertanent information unless stated regarding to the chief complaint.   Review of Systems:  No headache, visual changes, nausea, vomiting, diarrhea, constipation, dizziness, abdominal pain, skin rash, fevers, chills, night sweats, weight loss, swollen lymph nodes, body aches, joint swelling, chest pain, shortness of breath, mood changes. POSITIVE muscle aches  Objective  Blood pressure 140/80, pulse 75, height 5\' 10"  (1.778 m), weight 212 lb (96.2 kg), SpO2 99 %.   General: No apparent distress alert and oriented x3 mood and affect normal, dressed appropriately.  HEENT: Pupils equal, extraocular movements intact  Respiratory: Patient's speak in full sentences and does not appear short of breath  Cardiovascular: No lower extremity edema, non tender, no erythema  Gait antalgic gait noted. Right knee exam shows the patient does have arthritic changes noted. Patient does have a small effusion noted of the joint.  Crepitus noted with range of motion.  Neurovascular intact distally.  Limited muscular skeletal ultrasound was performed and interpreted by , M  Limited ultrasound of patient's right knee shows there is moderate narrowing of the patellofemoral joint with trace effusion and patient does have a moderate to severe narrowing of the medial joint space with a degenerative meniscal tear with mild displacement. 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 Right knee was prepped with alcohol swab and utilizing  anterolateral approach, patient's right knee space was injected with 4:1  marcaine 0.5%: Kenalog 40mg /dL. Patient tolerated the procedure well without immediate complications.   Impression and Recommendations:     The above documentation has been reviewed and is accurate and complete Antoine Primas, DO

## 2020-11-04 ENCOUNTER — Other Ambulatory Visit: Payer: Self-pay

## 2020-11-04 ENCOUNTER — Encounter: Payer: Self-pay | Admitting: Family Medicine

## 2020-11-04 ENCOUNTER — Ambulatory Visit: Payer: BC Managed Care – PPO | Admitting: Family Medicine

## 2020-11-04 ENCOUNTER — Ambulatory Visit: Payer: Self-pay

## 2020-11-04 VITALS — BP 140/80 | HR 75 | Ht 70.0 in | Wt 212.0 lb

## 2020-11-04 DIAGNOSIS — M1711 Unilateral primary osteoarthritis, right knee: Secondary | ICD-10-CM

## 2020-11-04 DIAGNOSIS — M25511 Pain in right shoulder: Secondary | ICD-10-CM | POA: Diagnosis not present

## 2020-11-04 MED ORDER — TRAMADOL HCL 50 MG PO TABS: 50.0000 mg | ORAL_TABLET | Freq: Three times a day (TID) | ORAL | 0 refills | Status: AC | PRN

## 2020-11-04 NOTE — Patient Instructions (Signed)
Injection today See you again in 6 weeks 

## 2020-11-04 NOTE — Assessment & Plan Note (Signed)
Repeat injection given today.  Tolerated the procedure well.  Discussed icing regimen and home exercises.  Discussed avoiding certain activities.  Patient is not having any significant instability.  We will repeat x-rays to further evaluate to see if there has been any progression over the last 5 years.  Contralateral side did need a arthroscopic meniscectomy.  We will continue to monitor and follow-up with me again in 6 weeks.  Could be a candidate for potential viscosupplementation.

## 2020-11-15 ENCOUNTER — Ambulatory Visit: Payer: BC Managed Care – PPO | Admitting: Family Medicine

## 2020-11-28 ENCOUNTER — Other Ambulatory Visit (HOSPITAL_COMMUNITY): Payer: Self-pay

## 2020-11-29 ENCOUNTER — Other Ambulatory Visit (HOSPITAL_COMMUNITY): Payer: Self-pay

## 2020-12-05 ENCOUNTER — Encounter: Payer: Self-pay | Admitting: Internal Medicine

## 2020-12-15 NOTE — Progress Notes (Signed)
Entered in error

## 2020-12-16 ENCOUNTER — Encounter (INDEPENDENT_AMBULATORY_CARE_PROVIDER_SITE_OTHER): Payer: BC Managed Care – PPO | Admitting: Family Medicine

## 2020-12-16 ENCOUNTER — Other Ambulatory Visit: Payer: Self-pay

## 2020-12-16 ENCOUNTER — Ambulatory Visit: Payer: Self-pay

## 2020-12-16 ENCOUNTER — Encounter: Payer: Self-pay | Admitting: Family Medicine

## 2020-12-16 VITALS — BP 142/80 | HR 72 | Ht 70.0 in | Wt 212.0 lb

## 2020-12-16 DIAGNOSIS — M25511 Pain in right shoulder: Secondary | ICD-10-CM

## 2020-12-30 ENCOUNTER — Other Ambulatory Visit (HOSPITAL_COMMUNITY): Payer: Self-pay

## 2021-01-03 ENCOUNTER — Other Ambulatory Visit: Payer: Self-pay | Admitting: Internal Medicine

## 2021-01-03 MED ORDER — BENZONATATE 200 MG PO CAPS: 200.0000 mg | ORAL_CAPSULE | Freq: Three times a day (TID) | ORAL | 0 refills | Status: DC | PRN

## 2021-01-04 ENCOUNTER — Telehealth: Payer: Self-pay | Admitting: Family Medicine

## 2021-01-04 NOTE — Telephone Encounter (Signed)
Pt states we advised her the gel injection for her knee was denied by her insurance. She called her insurance and they claim that they approved. I see an old approval that has since expired, but pt states insurance re-approved in November.  Please confirm as pt wants to schedule, OK to leave detailed msg.

## 2021-01-04 NOTE — Telephone Encounter (Signed)
Authorization for Synvisc located, pt scheduled for 01/19/2021.

## 2021-01-15 ENCOUNTER — Other Ambulatory Visit: Payer: Self-pay | Admitting: Internal Medicine

## 2021-01-18 NOTE — Progress Notes (Signed)
[name] [address] [place] [address] Phone: [phone] Subjective:   [name], am serving as a scribe for Dr. Antoine Primas. This visit occurred during the SARS-CoV-2 public health emergency.  Safety protocols were in place, including screening questions prior to the visit, additional usage of staff PPE, and extensive cleaning of exam room while observing appropriate contact time as indicated for disinfecting solutions.   I'm seeing this patient by the request  of:  Myrlene Broker, MD  CC: Right knee pain  [facility]:[id number]  11/04/2020 Repeat injection given today.  Tolerated the procedure well.  Discussed icing regimen and home exercises.  Discussed avoiding certain activities.  Patient is not having any significant instability.  We will repeat x-rays to further evaluate to see if there has been any progression over the last 5 years.  Contralateral side did need a arthroscopic meniscectomy.  We will continue to monitor and follow-up with me again in 6 weeks.  Could be a candidate for potential viscosupplementation.  Update 01/19/2021 Kirsten Campbell is a 60 y.o. female coming in with complaint of R knee pain. Patient states started to get worse about 2 weeks ago then 2 days ago got really bad. Swelling is bad today. Wants injection.      Past Medical History:  Diagnosis Date   Asthma    Chicken pox    Heart murmur    Hyperlipidemia    Hypertension    Past Surgical History:  Procedure Laterality Date   ABDOMINAL HYSTERECTOMY  1995   Social History   Socioeconomic History   Marital status: Married    Spouse name: Not on file   Number of children: Not on file   Years of education: Not on file   Highest education level: Not on file  Occupational History   Not on file  Tobacco Use   Smoking status: Never   Smokeless tobacco: Never  Substance and Sexual Activity   Alcohol use: No   Drug use: No   Sexual activity: Not  on file  Other Topics Concern   Not on file  Social History Narrative   Not on file   Social Determinants of Health   Financial Resource Strain: Not on file  Food Insecurity: Not on file  Transportation Needs: Not on file  Physical Activity: Not on file  Stress: Not on file  Social Connections: Not on file   Allergies  Allergen Reactions   Penicillins Hives   Family History  Problem Relation Age of Onset   Cancer Mother        Breast   Hypertension Mother    Breast cancer Mother 13   Hypertension Father    Hypertension Sister    Breast cancer Sister 68   Hypertension Brother    Heart murmur Child     Current Outpatient Medications (Endocrine & Metabolic):    predniSONE (DELTASONE) 20 MG tablet, Take 1 tablet (20 mg total) by mouth daily with breakfast.   estradiol (ESTRACE) 2 MG tablet, Take 2 mg by mouth daily.   estradiol (ESTRACE) 2 MG tablet, TAKE 2 TABLETS BY MOUTH EVERY DAY   estradiol (ESTRACE) 2 MG tablet, Take 2 tablets (4 mg total) by mouth daily.  Current Outpatient Medications (Cardiovascular):    amLODipine (NORVASC) 10 MG tablet, TAKE 1 TABLET(10 MG) BY MOUTH DAILY   fenofibrate 160 MG tablet, Take 160 mg by mouth daily. Reported on 02/17/2015   losartan-hydrochlorothiazide (HYZAAR) 100-12.5 MG  tablet, TAKE 1 TABLET BY MOUTH EVERY DAY   pravastatin (PRAVACHOL) 20 MG tablet, Take 1 tablet (20 mg total) by mouth daily.   verapamil (CALAN-SR) 180 MG CR tablet, TAKE 1 TABLET(180 MG) BY MOUTH DAILY  Current Outpatient Medications (Respiratory):    benzonatate (TESSALON) 200 MG capsule, Take 1 capsule (200 mg total) by mouth 3 (three) times daily as needed.  Current Outpatient Medications (Analgesics):    acetaminophen (TYLENOL) 500 MG tablet, Take 500 mg by mouth 3 (three) times daily.   aspirin 81 MG tablet, Take 81 mg by mouth daily.  Current Outpatient Medications (Hematological):    Ferrous Sulfate (IRON) 325 (65 Fe) MG TABS, Take by mouth. Take one by  mouth three times a week.  Current Outpatient Medications (Other):    doxycycline (VIBRA-TABS) 100 MG tablet, Take 1 tablet (100 mg total) by mouth 2 (two) times daily.   cyclobenzaprine (FLEXERIL) 10 MG tablet, TAKE 1 TABLET(10 MG) BY MOUTH TWICE DAILY   gabapentin (NEURONTIN) 400 MG capsule, TAKE 1 CAPSULE BY MOUTH DAILY AT BEDTIME   Multiple Vitamin (MULTIVITAMIN) tablet, Take 1 tablet by mouth daily.   TURMERIC PO, Take by mouth.   Vitamin D, Cholecalciferol, 1000 units TABS, Take 2,000 Units by mouth daily.   Reviewed prior external information including notes and imaging from  primary care provider As well as notes that were available from care everywhere and other healthcare systems.  Past medical history, social, surgical and family history all reviewed in electronic medical record.  No pertanent information unless stated regarding to the chief complaint.   Review of Systems:  No headache, visual changes, nausea, vomiting, diarrhea, constipation, dizziness, abdominal pain, skin rash, fevers, chills, night sweats, weight loss, swollen lymph nodes, body aches, joint swelling, chest pain, shortness of breath, mood changes. POSITIVE muscle aches  Objective  Blood pressure 138/86, pulse 99, height 5\' 10"  (1.778 m), weight 215 lb (97.5 kg), SpO2 96 %.   General: No apparent distress alert and oriented x3 mood and affect normal, dressed appropriately.  HEENT: Pupils equal, extraocular movements intact  Respiratory: Patient's speak in full sentences and does not appear short of breath  Cardiovascular: No lower extremity edema, non tender, no erythema  Gait significant antalgic gait favoring the right knee MSK: Right knee exam shows the patient does have swelling noted. Patient does have significant swelling noted.  Limited range of motion in both planes secondary to pain and swelling,  Procedure: Real-time Ultrasound Guided Injection of right knee Device: GE Logiq Q7 Ultrasound  guided injection is preferred based studies that show increased duration, increased effect, greater accuracy, decreased procedural pain, increased response rate, and decreased cost with ultrasound guided versus blind injection.  Verbal informed consent obtained.  Time-out conducted.  Noted no overlying erythema, induration, or other signs of local infection.  Skin prepped in a sterile fashion.  Local anesthesia: Topical Ethyl chloride.  With sterile technique and under real time ultrasound guidance: With a 22-gauge 2 inch needle patient was injected with 4 cc of 0.5% Marcaine and aspirated 50 cc of cloudy yellow-tinged fluid This was from a superior lateral approach.  Completed without difficulty  Pain immediately resolved suggesting accurate placement of the medication.  Advised to call if fevers/chills, erythema, induration, drainage, or persistent bleeding.  Impression: Technically successful ultrasound guided injection.   Impression and Recommendations:     The above documentation has been reviewed and is accurate and complete , DO

## 2021-01-19 ENCOUNTER — Encounter: Payer: Self-pay | Admitting: Family Medicine

## 2021-01-19 ENCOUNTER — Other Ambulatory Visit: Payer: Self-pay

## 2021-01-19 ENCOUNTER — Ambulatory Visit: Payer: Self-pay

## 2021-01-19 ENCOUNTER — Ambulatory Visit (INDEPENDENT_AMBULATORY_CARE_PROVIDER_SITE_OTHER): Payer: BC Managed Care – PPO | Admitting: Family Medicine

## 2021-01-19 VITALS — BP 138/86 | HR 99 | Ht 70.0 in | Wt 215.0 lb

## 2021-01-19 DIAGNOSIS — M2550 Pain in unspecified joint: Secondary | ICD-10-CM

## 2021-01-19 DIAGNOSIS — M1711 Unilateral primary osteoarthritis, right knee: Secondary | ICD-10-CM

## 2021-01-19 LAB — TIQ-NTM

## 2021-01-19 LAB — SYNOVIAL FLUID ANALYSIS, COMPLETE
Basophils, %: 0 %
Eosinophils-Synovial: 0 % (ref 0–2)
Lymphocytes-Synovial Fld: 1 % (ref 0–74)
Monocyte/Macrophage: 10 % (ref 0–69)
Neutrophil, Synovial: 89 % — ABNORMAL HIGH (ref 0–24)
Synoviocytes, %: 0 % (ref 0–15)
WBC, Synovial: 9030 cells/uL — ABNORMAL HIGH (ref <150)

## 2021-01-19 MED ORDER — PREDNISONE 20 MG PO TABS: 20.0000 mg | ORAL_TABLET | Freq: Every day | ORAL | 0 refills | Status: DC

## 2021-01-19 MED ORDER — DOXYCYCLINE HYCLATE 100 MG PO TABS: 100.0000 mg | ORAL_TABLET | Freq: Two times a day (BID) | ORAL | 0 refills | Status: DC

## 2021-01-19 NOTE — Assessment & Plan Note (Signed)
Patient had aspiration done today.  Unfortunately aspiration was concerning for potential infectious etiology.  Patient's vitals are unremarkable.  Will start doxycycline.  Differential includes also potentially gout.  Discussed icing regimen and home exercises otherwise.  Discussed that I would like to see patient again in a week to make sure she is making some improvement and then hopefully we would be able to do the viscosupplementation.  Patient understands if worsening pain to seek medical attention immediately.

## 2021-01-19 NOTE — Patient Instructions (Addendum)
See you again in next week (double book okay)

## 2021-01-24 ENCOUNTER — Ambulatory Visit: Payer: BC Managed Care – PPO | Admitting: Family Medicine

## 2021-01-24 ENCOUNTER — Other Ambulatory Visit: Payer: Self-pay

## 2021-01-24 DIAGNOSIS — M1711 Unilateral primary osteoarthritis, right knee: Secondary | ICD-10-CM | POA: Diagnosis not present

## 2021-01-24 NOTE — Assessment & Plan Note (Signed)
Injection given today, tolerated the procedure well, discussed icing regimen and home exercise, discussed which activities to do which wants to avoid.  Patient has not done viscosupplementation on the contralateral knee and knows what to watch out for.  Patient did have the inflammation noted previously and we will monitor.  Patient knows if worsening symptoms we do need to consider about progressing with the steroid.  Hopeful though that patient should do relatively well overall.

## 2021-01-24 NOTE — Patient Instructions (Addendum)
Gel injection today See me again in 4-6 weeks Call if swelling or inflammation

## 2021-01-24 NOTE — Progress Notes (Signed)
[name] [address] [place] [address] Phone: [phone] Subjective:   Kirsten Campbell, am serving as a scribe for Dr. Antoine Primas. This visit occurred during the SARS-CoV-2 public health emergency.  Safety protocols were in place, including screening questions prior to the visit, additional usage of staff PPE, and extensive cleaning of exam room while observing appropriate contact time as indicated for disinfecting solutions.   I'm seeing this patient by the request  of:  Myrlene Broker, MD  CC: Right knee pain follow-up  [id number]  01/19/2021 Patient had aspiration done today.  Unfortunately aspiration was concerning for potential infectious etiology.  Patient's vitals are unremarkable.  Will start doxycycline.  Differential includes also potentially gout.  Discussed icing regimen and home exercises otherwise.  Discussed that I would like to see patient again in a week to make sure she is making some improvement and then hopefully we would be able to do the viscosupplementation.  Patient understands if worsening pain to seek medical attention immediately.  Updated 01/24/2021 Kirsten Campbell is a 60 y.o. female coming in with complaint of right knee pain. Here for gel injections. Wants to go over labs patient was found to have some mild elevation in the white blood cell count.  Has been doing the antibiotic.  No side effect.  Feels that since the aspiration of the knee has felt significantly better.       Past Medical History:  Diagnosis Date   Asthma    Chicken pox    Heart murmur    Hyperlipidemia    Hypertension    Past Surgical History:  Procedure Laterality Date   ABDOMINAL HYSTERECTOMY  1995   Social History   Socioeconomic History   Marital status: Married    Spouse name: Not on file   Number of children: Not on file   Years of education: Not on file   Highest education level: Not on file  Occupational History    Not on file  Tobacco Use   Smoking status: Never   Smokeless tobacco: Never  Substance and Sexual Activity   Alcohol use: No   Drug use: No   Sexual activity: Not on file  Other Topics Concern   Not on file  Social History Narrative   Not on file   Social Determinants of Health   Financial Resource Strain: Not on file  Food Insecurity: Not on file  Transportation Needs: Not on file  Physical Activity: Not on file  Stress: Not on file  Social Connections: Not on file   Allergies  Allergen Reactions   Penicillins Hives   Family History  Problem Relation Age of Onset   Cancer Mother        Breast   Hypertension Mother    Breast cancer Mother 30   Hypertension Father    Hypertension Sister    Breast cancer Sister 38   Hypertension Brother    Heart murmur Child     Current Outpatient Medications (Endocrine & Metabolic):    estradiol (ESTRACE) 2 MG tablet, Take 2 mg by mouth daily.   estradiol (ESTRACE) 2 MG tablet, TAKE 2 TABLETS BY MOUTH EVERY DAY   estradiol (ESTRACE) 2 MG tablet, Take 2 tablets (4 mg total) by mouth daily.   predniSONE (DELTASONE) 20 MG tablet, Take 1 tablet (20 mg total) by mouth daily with breakfast.  Current Outpatient Medications (Cardiovascular):    amLODipine (NORVASC) 10 MG tablet, TAKE 1 TABLET(10  MG) BY MOUTH DAILY   fenofibrate 160 MG tablet, Take 160 mg by mouth daily. Reported on 02/17/2015   losartan-hydrochlorothiazide (HYZAAR) 100-12.5 MG tablet, TAKE 1 TABLET BY MOUTH EVERY DAY   pravastatin (PRAVACHOL) 20 MG tablet, Take 1 tablet (20 mg total) by mouth daily.   verapamil (CALAN-SR) 180 MG CR tablet, TAKE 1 TABLET(180 MG) BY MOUTH DAILY  Current Outpatient Medications (Respiratory):    benzonatate (TESSALON) 200 MG capsule, Take 1 capsule (200 mg total) by mouth 3 (three) times daily as needed.  Current Outpatient Medications (Analgesics):    acetaminophen (TYLENOL) 500 MG tablet, Take 500 mg by mouth 3 (three) times daily.    aspirin 81 MG tablet, Take 81 mg by mouth daily.  Current Outpatient Medications (Hematological):    Ferrous Sulfate (IRON) 325 (65 Fe) MG TABS, Take by mouth. Take one by mouth three times a week.  Current Outpatient Medications (Other):    cyclobenzaprine (FLEXERIL) 10 MG tablet, TAKE 1 TABLET(10 MG) BY MOUTH TWICE DAILY   doxycycline (VIBRA-TABS) 100 MG tablet, Take 1 tablet (100 mg total) by mouth 2 (two) times daily.   gabapentin (NEURONTIN) 400 MG capsule, TAKE 1 CAPSULE BY MOUTH DAILY AT BEDTIME   Multiple Vitamin (MULTIVITAMIN) tablet, Take 1 tablet by mouth daily.   TURMERIC PO, Take by mouth.   Vitamin D, Cholecalciferol, 1000 units TABS, Take 2,000 Units by mouth daily.   Reviewed prior external information including notes and imaging from  primary care provider As well as notes that were available from care everywhere and other healthcare systems.  Past medical history, social, surgical and family history all reviewed in electronic medical record.  No pertanent information unless stated regarding to the chief complaint.   Review of Systems:  No headache, visual changes, nausea, vomiting, diarrhea, constipation, dizziness, abdominal pain, skin rash, fevers, chills, night sweats, weight loss, swollen lymph nodes, body aches, joint swelling, chest pain, shortness of breath, mood changes. POSITIVE muscle aches  Objective  Pulse 81, height 5\' 10"  (1.778 m), SpO2 97 %.   General: No apparent distress alert and oriented x3 mood and affect normal, dressed appropriately.  HEENT: Pupils equal, extraocular movements intact  Respiratory: Patient's speak in full sentences and does not appear short of breath  Cardiovascular: No lower extremity edema, non tender, no erythema  Gait normal with good balance and coordination.  MSK: Right knee exam does still have the arthritic changes noted.  No significant swelling at that time.  Improvement in range of motion.  Patient is ambulating  significantly improved as well.  Still has instability with valgus and varus force  After informed written and verbal consent, patient was seated on exam table. Right knee was prepped with alcohol swab and utilizing anterolateral approach, patient's right knee space was injected with 48 mg per 3 mL of Synvisc 1 (sodium hyaluronate) in a prefilled syringe was injected easily into the knee through a 22-gauge Campbell..Patient tolerated the procedure well without immediate complications.    Impression and Recommendations:    The above documentation has been reviewed and is accurate and complete , DO

## 2021-03-02 NOTE — Progress Notes (Signed)
[facility] [address] [place] [address] Phone: [phone] Subjective:   Kirsten Campbell, am serving as a scribe for Dr. Antoine Primas. This visit occurred during the SARS-CoV-2 public health emergency.  Safety protocols were in place, including screening questions prior to the visit, additional usage of staff PPE, and extensive cleaning of exam room while observing appropriate contact time as indicated for disinfecting solutions.   I'm seeing this patient by the request  of:  Myrlene Broker, MD  CC: left knee pain   [id number]  01/24/2021 Injection given today, tolerated the procedure well, discussed icing regimen and home exercise, discussed which activities to do which wants to avoid.  Patient has not done viscosupplementation on the contralateral knee and knows what to watch out for.  Patient did have the inflammation noted previously and we will monitor.  Patient knows if worsening symptoms we do need to consider about progressing with the steroid.  Hopeful though that patient should do relatively well overall.  Update 03/07/2021 Kirsten Campbell is a 61 y.o. female coming in with complaint of R knee pain.  Patient was last seen 6 weeks ago and given viscosupplementation.  Patient states knees are doing well. Only really feel bad when walking too fast. Able to do more and sleep better.       Past Medical History:  Diagnosis Date   Asthma    Chicken pox    Heart murmur    Hyperlipidemia    Hypertension    Past Surgical History:  Procedure Laterality Date   ABDOMINAL HYSTERECTOMY  1995   Social History   Socioeconomic History   Marital status: Married    Spouse name: Not on file   Number of children: Not on file   Years of education: Not on file   Highest education level: Not on file  Occupational History   Not on file  Tobacco Use   Smoking status: Never   Smokeless tobacco: Never  Substance and Sexual Activity    Alcohol use: No   Drug use: No   Sexual activity: Not on file  Other Topics Concern   Not on file  Social History Narrative   Not on file   Social Determinants of Health   Financial Resource Strain: Not on file  Food Insecurity: Not on file  Transportation Needs: Not on file  Physical Activity: Not on file  Stress: Not on file  Social Connections: Not on file   Allergies  Allergen Reactions   Penicillins Hives   Family History  Problem Relation Age of Onset   Cancer Mother        Breast   Hypertension Mother    Breast cancer Mother 62   Hypertension Father    Hypertension Sister    Breast cancer Sister 16   Hypertension Brother    Heart murmur Child     Current Outpatient Medications (Endocrine & Metabolic):    estradiol (ESTRACE) 2 MG tablet, Take 2 mg by mouth daily.   estradiol (ESTRACE) 2 MG tablet, Take 2 tablets (4 mg total) by mouth daily.  Current Outpatient Medications (Cardiovascular):    amLODipine (NORVASC) 10 MG tablet, Take 1 tablet (10 mg total) by mouth daily.   fenofibrate 160 MG tablet, Take 160 mg by mouth daily. Reported on 02/17/2015   losartan-hydrochlorothiazide (HYZAAR) 100-12.5 MG tablet, Take 1 tablet by mouth daily.   pravastatin (PRAVACHOL) 20 MG tablet, Take 1 tablet (20 mg total) by  mouth daily.   verapamil (CALAN-SR) 180 MG CR tablet, Take 1 tablet (180 mg total) by mouth daily.  Current Outpatient Medications (Respiratory):    fluticasone (FLONASE) 50 MCG/ACT nasal spray, Place 2 sprays into both nostrils daily.  Current Outpatient Medications (Analgesics):    acetaminophen (TYLENOL) 500 MG tablet, Take 500 mg by mouth 3 (three) times daily.   aspirin 81 MG tablet, Take 81 mg by mouth daily.  Current Outpatient Medications (Hematological):    Ferrous Sulfate (IRON) 325 (65 Fe) MG TABS, Take by mouth. Take one by mouth three times a week.  Current Outpatient Medications (Other):    cyclobenzaprine (FLEXERIL) 10 MG tablet, TAKE 1  TABLET(10 MG) BY MOUTH TWICE DAILY   gabapentin (NEURONTIN) 400 MG capsule, TAKE 1 CAPSULE BY MOUTH DAILY AT BEDTIME   Multiple Vitamin (MULTIVITAMIN) tablet, Take 1 tablet by mouth daily.   TURMERIC PO, Take by mouth.   Vitamin D, Cholecalciferol, 1000 units TABS, Take 2,000 Units by mouth daily.   Reviewed prior external information including notes and imaging from  primary care provider As well as notes that were available from care everywhere and other healthcare systems.  Past medical history, social, surgical and family history all reviewed in electronic medical record.  No pertanent information unless stated regarding to the chief complaint.   Review of Systems:  No headache, visual changes, nausea, vomiting, diarrhea, constipation, dizziness, abdominal pain, skin rash, fevers, chills, night sweats, weight loss, swollen lymph nodes, body aches, joint swelling, chest pain, shortness of breath, mood changes. POSITIVE muscle aches  Objective  Blood pressure 130/84, pulse 81, height 5\' 10"  (1.778 m), weight 208 lb (94.3 kg), SpO2 99 %.   General: No apparent distress alert and oriented x3 mood and affect normal, dressed appropriately.  Overweight HEENT: Pupils equal, extraocular movements intact  Respiratory: Patient's speak in full sentences and does not appear short of breath  Cardiovascular: No lower extremity edema, non tender, no erythema  Gait normal with good balance and coordination.  MSK: Right knee exam still has some lateral tracking of the patella.  Positive patellar grind test noted.  Mild instability with valgus and varus force. Mild tenderness over the medial joint line.    Impression and Recommendations:     The above documentation has been reviewed and is accurate and complete , DO

## 2021-03-06 ENCOUNTER — Telehealth: Payer: Self-pay | Admitting: Internal Medicine

## 2021-03-06 NOTE — Telephone Encounter (Signed)
She is scheduled for tomorrow morning at 8:00 am. Fyi.

## 2021-03-06 NOTE — Telephone Encounter (Signed)
Connected to [facility] 2.4.2023.  Caller states she has green eye discharge coming from both eyes (also red). Several times overnight-- eyes were matted shut. She has been very congested with cold symptoms. Testing Covid negative. 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  Advised Home Care

## 2021-03-07 ENCOUNTER — Encounter: Payer: Self-pay | Admitting: Internal Medicine

## 2021-03-07 ENCOUNTER — Ambulatory Visit (INDEPENDENT_AMBULATORY_CARE_PROVIDER_SITE_OTHER): Payer: BC Managed Care – PPO | Admitting: Family Medicine

## 2021-03-07 ENCOUNTER — Encounter: Payer: Self-pay | Admitting: Family Medicine

## 2021-03-07 ENCOUNTER — Ambulatory Visit (INDEPENDENT_AMBULATORY_CARE_PROVIDER_SITE_OTHER): Payer: BC Managed Care – PPO | Admitting: Internal Medicine

## 2021-03-07 ENCOUNTER — Other Ambulatory Visit: Payer: Self-pay | Admitting: Internal Medicine

## 2021-03-07 ENCOUNTER — Other Ambulatory Visit: Payer: Self-pay

## 2021-03-07 VITALS — BP 118/84 | HR 78 | Resp 18 | Ht 70.0 in | Wt 208.8 lb

## 2021-03-07 DIAGNOSIS — M1711 Unilateral primary osteoarthritis, right knee: Secondary | ICD-10-CM | POA: Diagnosis not present

## 2021-03-07 DIAGNOSIS — Z0000 Encounter for general adult medical examination without abnormal findings: Secondary | ICD-10-CM | POA: Diagnosis not present

## 2021-03-07 DIAGNOSIS — E782 Mixed hyperlipidemia: Secondary | ICD-10-CM

## 2021-03-07 DIAGNOSIS — R7301 Impaired fasting glucose: Secondary | ICD-10-CM | POA: Diagnosis not present

## 2021-03-07 DIAGNOSIS — I10 Essential (primary) hypertension: Secondary | ICD-10-CM | POA: Diagnosis not present

## 2021-03-07 LAB — HEMOGLOBIN A1C: Hgb A1c MFr Bld: 6.2 % (ref 4.6–6.5)

## 2021-03-07 LAB — LIPID PANEL
Cholesterol: 206 mg/dL — ABNORMAL HIGH (ref 0–200)
HDL: 62.6 mg/dL (ref >39.00)
LDL Cholesterol: 108 mg/dL — ABNORMAL HIGH (ref 0–99)
NonHDL: 143.1
Total CHOL/HDL Ratio: 3
Triglycerides: 177 mg/dL — ABNORMAL HIGH (ref 0.0–149.0)
VLDL: 35.4 mg/dL (ref 0.0–40.0)

## 2021-03-07 LAB — CBC
HCT: 40.5 % (ref 36.0–46.0)
Hemoglobin: 13.1 g/dL (ref 12.0–15.0)
MCHC: 32.3 g/dL (ref 30.0–36.0)
MCV: 87.3 fl (ref 78.0–100.0)
Platelets: 307 10*3/uL (ref 150.0–400.0)
RBC: 4.64 Mil/uL (ref 3.87–5.11)
RDW: 13.3 % (ref 11.5–15.5)
WBC: 8.5 10*3/uL (ref 4.0–10.5)

## 2021-03-07 LAB — COMPREHENSIVE METABOLIC PANEL
ALT: 17 U/L (ref 0–35)
AST: 20 U/L (ref 0–37)
Albumin: 4.1 g/dL (ref 3.5–5.2)
Alkaline Phosphatase: 56 U/L (ref 39–117)
BUN: 12 mg/dL (ref 6–23)
CO2: 31 mEq/L (ref 19–32)
Calcium: 10 mg/dL (ref 8.4–10.5)
Chloride: 101 mEq/L (ref 96–112)
Creatinine, Ser: 0.9 mg/dL (ref 0.40–1.20)
GFR: 68.31 mL/min (ref >60.00)
Glucose, Bld: 106 mg/dL — ABNORMAL HIGH (ref 70–99)
Potassium: 3.1 mEq/L — ABNORMAL LOW (ref 3.5–5.1)
Sodium: 138 mEq/L (ref 135–145)
Total Bilirubin: 0.2 mg/dL (ref 0.2–1.2)
Total Protein: 7.4 g/dL (ref 6.0–8.3)

## 2021-03-07 MED ORDER — VERAPAMIL HCL ER 180 MG PO TBCR: 180.0000 mg | EXTENDED_RELEASE_TABLET | Freq: Every day | ORAL | 3 refills | Status: DC

## 2021-03-07 MED ORDER — FLUTICASONE PROPIONATE 50 MCG/ACT NA SUSP: 2.0000 | Freq: Every day | NASAL | 6 refills | Status: DC

## 2021-03-07 MED ORDER — AMLODIPINE BESYLATE 10 MG PO TABS: 10.0000 mg | ORAL_TABLET | Freq: Every day | ORAL | 3 refills | Status: DC

## 2021-03-07 MED ORDER — LOSARTAN POTASSIUM-HCTZ 100-12.5 MG PO TABS: 1.0000 | ORAL_TABLET | Freq: Every day | ORAL | 3 refills | Status: DC

## 2021-03-07 MED ORDER — PRAVASTATIN SODIUM 20 MG PO TABS: 20.0000 mg | ORAL_TABLET | Freq: Every day | ORAL | 3 refills | Status: DC

## 2021-03-07 NOTE — Assessment & Plan Note (Signed)
Patient is doing relatively well overall.  Does have a degenerative arthritis.  Wants to hold on any type of surgical intervention.  He did respond well to the viscosupplementation.  Follow-up again in 3 months

## 2021-03-07 NOTE — Assessment & Plan Note (Signed)
Flu shot up to date. Covid-19 up to date. Shingrix complete. Tetanus up to date. Colonoscopy due 2024. Mammogram up to date, pap smear not indicated. Counseled about sun safety and mole surveillance. Counseled about the dangers of distracted driving. Given 10 year screening recommendations.

## 2021-03-07 NOTE — Assessment & Plan Note (Signed)
Taking pravastatin 20 mg daily. Checking lipid panel and adjust as needed. 

## 2021-03-07 NOTE — Assessment & Plan Note (Signed)
Checking HgA1c and adjust as needed.  

## 2021-03-07 NOTE — Progress Notes (Signed)
° °  Subjective:   Patient ID: Kirsten Campbell, female    DOB: [birth year], 61 y.o.   MRN: [id number]  HPI The patient is a 61 YO female coming in for physical.   PMH, FMH, social history reviewed and updated  Review of Systems  Constitutional: Negative.   HENT: Negative.    Eyes: Negative.   Respiratory:  Negative for cough, chest tightness and shortness of breath.   Cardiovascular:  Negative for chest pain, palpitations and leg swelling.  Gastrointestinal:  Negative for abdominal distention, abdominal pain, constipation, diarrhea, nausea and vomiting.  Musculoskeletal: Negative.   Skin: Negative.   Neurological: Negative.   Psychiatric/Behavioral: Negative.     Objective:  Physical Exam Constitutional:      Appearance: She is well-developed.  HENT:     Head: Normocephalic and atraumatic.  Cardiovascular:     Rate and Rhythm: Normal rate and regular rhythm.  Pulmonary:     Effort: Pulmonary effort is normal. No respiratory distress.     Breath sounds: Normal breath sounds. No wheezing or rales.  Abdominal:     General: Bowel sounds are normal. There is no distension.     Palpations: Abdomen is soft.     Tenderness: There is no abdominal tenderness. There is no rebound.  Musculoskeletal:     Cervical back: Normal range of motion.  Skin:    General: Skin is warm and dry.  Neurological:     Mental Status: She is alert and oriented to person, place, and time.     Coordination: Coordination normal.    Vitals:   03/07/21 0803  BP: 118/84  Pulse: 78  Resp: 18  SpO2: 94%  Weight: 208 lb 12.8 oz (94.7 kg)  Height: 5\' 10"  (1.778 m)    This visit occurred during the SARS-CoV-2 public health emergency.  Safety protocols were in place, including screening questions prior to the visit, additional usage of staff PPE, and extensive cleaning of exam room while observing appropriate contact time as indicated for disinfecting solutions.   Assessment & Plan:

## 2021-03-07 NOTE — Assessment & Plan Note (Signed)
BP at goal on amlodipine 10 mg daily and losartan/hctz 100/12.5 mg daily and verapamil 180 mg daily. Checking CMP and adjust as needed.

## 2021-03-07 NOTE — Patient Instructions (Signed)
Good to see you! Overall should do fine for many months Shorter strides when going up or down hills See you again in 3 months

## 2021-03-07 NOTE — Patient Instructions (Signed)
If the eyes are not better at 5 days let us know and we can send in an alternative.   We have sent in the flonase to use 2 sprays in each nostril daily for 1-2 weeks to help clear up the sinuses.

## 2021-03-13 ENCOUNTER — Other Ambulatory Visit: Payer: Self-pay | Admitting: Internal Medicine

## 2021-03-13 MED ORDER — POTASSIUM CHLORIDE CRYS ER 20 MEQ PO TBCR: 20.0000 meq | EXTENDED_RELEASE_TABLET | Freq: Every day | ORAL | 0 refills | Status: DC

## 2021-03-16 ENCOUNTER — Encounter: Payer: Self-pay | Admitting: Internal Medicine

## 2021-03-19 ENCOUNTER — Encounter: Payer: Self-pay | Admitting: Internal Medicine

## 2021-03-31 ENCOUNTER — Other Ambulatory Visit (HOSPITAL_COMMUNITY): Payer: Self-pay

## 2021-04-07 ENCOUNTER — Telehealth: Payer: Self-pay | Admitting: Internal Medicine

## 2021-04-07 NOTE — Telephone Encounter (Signed)
Called pt. LDVM at [phone] letting her know that Dr. Katrinka Blazing in sport medicine prescribed the medication, so she will need to contact their office for her refill. Office number was provided in case she has additional questions or concerns.  ?

## 2021-04-07 NOTE — Telephone Encounter (Signed)
Spoke with the Kirsten Campbell and she stated that she would call Dr. Katrinka Blazing for her flexeril refill. No other questions or concerns.  ?

## 2021-04-07 NOTE — Telephone Encounter (Signed)
Pt states Dr. Okey Dupre prescribes her  cyclobenzaprine (FLEXERIL) 10 MG tablet ? ?Pt requesting a c/b ?

## 2021-04-07 NOTE — Telephone Encounter (Signed)
Pt requesting a 90 day supply of cyclobenzaprine (FLEXERIL) 10 MG tablet ? ?Pt states it is cheaper for a 90 day supply vs. 60 day ? ?Pt LOV 03-07-2021 ?

## 2021-04-10 ENCOUNTER — Other Ambulatory Visit: Payer: Self-pay

## 2021-04-10 ENCOUNTER — Telehealth: Payer: Self-pay | Admitting: Family Medicine

## 2021-04-10 MED ORDER — CYCLOBENZAPRINE HCL 10 MG PO TABS: ORAL_TABLET | ORAL | 0 refills | Status: DC

## 2021-04-10 NOTE — Telephone Encounter (Signed)
Patient called requesting a refill on cyclobenzaprine (FLEXERIL) 10 MG tablet to be sent to [facility] on [address]. ?She asked if it could be sent in for a 90 day supply due to insurance coverage (it is cheaper for her to get 90 days). ? ?Please advise. ? ?

## 2021-04-10 NOTE — Telephone Encounter (Signed)
Rx called in for patient per a verbal from Dr. Katrinka Blazing.  ?

## 2021-04-11 NOTE — Telephone Encounter (Signed)
Per a verbal from Dr. Tamala Julian she can take medication 1x a day but called it in as such so that she would have a greater quantity of medication.  ?

## 2021-04-11 NOTE — Telephone Encounter (Signed)
Patient called stating that the directions on the prescription were incorrect. ?Flexeril 10 mg - Take one tablet by mouth once daily - Quantity: 90 ?

## 2021-04-13 ENCOUNTER — Other Ambulatory Visit: Payer: Self-pay

## 2021-04-13 MED ORDER — CYCLOBENZAPRINE HCL 10 MG PO TABS: ORAL_TABLET | ORAL | 0 refills | Status: DC

## 2021-06-05 NOTE — Progress Notes (Signed)
?Terrilee Files D.O. ?[facility] ?[address] [place] [address] ?Phone: [phone] ?Subjective:   ?I, Kirsten Campbell, am serving as a scribe for Dr. Antoine Primas. ? ?This visit occurred during the SARS-CoV-2 public health emergency.  Safety protocols were in place, including screening questions prior to the visit, additional usage of staff PPE, and extensive cleaning of exam room while observing appropriate contact time as indicated for disinfecting solutions.  ? ? ?I'm seeing this patient by the request  of:  Myrlene Broker, MD ? ?CC: Right knee pain ? ?[id number]  ?03/07/2021 ?Patient is doing relatively well overall.  Does have a degenerative arthritis.  Wants to hold on any type of surgical intervention.  He did respond well to the viscosupplementation.  Follow-up again in 3 months ? ?Update 06/06/2021 ?Kirsten Campbell is a 61 y.o. female coming in with complaint of R knee pain. SynVisc One given 01/24/2021 in R knee. Patient states that her pain is mostly medial on R knee. Takes Tramadol when pain is bad. Pain did improve after injection and she has good and bad days. Wants to know how long she should take gabapentin and mm relaxer. Patient takes mm relaxer one time a week. Wants to walk for exercise but her knee pain increases.  ? ?  ? ?Past Medical History:  ?Diagnosis Date  ? Asthma   ? Chicken pox   ? Heart murmur   ? Hyperlipidemia   ? Hypertension   ? ?Past Surgical History:  ?Procedure Laterality Date  ? ABDOMINAL HYSTERECTOMY  1995  ? ?Social History  ? ?Socioeconomic History  ? Marital status: Married  ?  Spouse name: Not on file  ? Number of children: Not on file  ? Years of education: Not on file  ? Highest education level: Not on file  ?Occupational History  ? Not on file  ?Tobacco Use  ? Smoking status: Never  ? Smokeless tobacco: Never  ?Substance and Sexual Activity  ? Alcohol use: No  ? Drug use: No  ? Sexual activity: Not on file  ?Other Topics Concern  ? Not on file   ?Social History Narrative  ? Not on file  ? ?Social Determinants of Health  ? ?Financial Resource Strain: Not on file  ?Food Insecurity: Not on file  ?Transportation Needs: Not on file  ?Physical Activity: Not on file  ?Stress: Not on file  ?Social Connections: Not on file  ? ?Allergies  ?Allergen Reactions  ? Penicillins Hives  ? ?Family History  ?Problem Relation Age of Onset  ? Cancer Mother   ?     Breast  ? Hypertension Mother   ? Breast cancer Mother 62  ? Hypertension Father   ? Hypertension Sister   ? Breast cancer Sister 10  ? Hypertension Brother   ? Heart murmur Child   ? ? ?Current Outpatient Medications (Endocrine & Metabolic):  ?  estradiol (ESTRACE) 2 MG tablet, Take 2 mg by mouth daily. ?  estradiol (ESTRACE) 2 MG tablet, Take 2 tablets (4 mg total) by mouth daily. ? ?Current Outpatient Medications (Cardiovascular):  ?  amLODipine (NORVASC) 10 MG tablet, Take 1 tablet (10 mg total) by mouth daily. ?  fenofibrate 160 MG tablet, Take 160 mg by mouth daily. Reported on 02/17/2015 ?  losartan-hydrochlorothiazide (HYZAAR) 100-12.5 MG tablet, Take 1 tablet by mouth daily. ?  pravastatin (PRAVACHOL) 20 MG tablet, Take 1 tablet (20 mg total) by mouth daily. ?  verapamil (CALAN-SR) 180 MG  CR tablet, Take 1 tablet (180 mg total) by mouth daily. ? ?Current Outpatient Medications (Respiratory):  ?  fluticasone (FLONASE) 50 MCG/ACT nasal spray, Place 2 sprays into both nostrils daily. ? ?Current Outpatient Medications (Analgesics):  ?  acetaminophen (TYLENOL) 500 MG tablet, Take 500 mg by mouth 3 (three) times daily. ?  aspirin 81 MG tablet, Take 81 mg by mouth daily. ? ?Current Outpatient Medications (Hematological):  ?  Ferrous Sulfate (IRON) 325 (65 Fe) MG TABS, Take by mouth. Take one by mouth three times a week. ? ?Current Outpatient Medications (Other):  ?  cyclobenzaprine (FLEXERIL) 10 MG tablet, Take 1 tablet (10mg ) by mouth daily. ?  gabapentin (NEURONTIN) 400 MG capsule, TAKE 1 CAPSULE BY MOUTH DAILY AT  BEDTIME ?  Multiple Vitamin (MULTIVITAMIN) tablet, Take 1 tablet by mouth daily. ?  TURMERIC PO, Take by mouth. ?  Vitamin D, Cholecalciferol, 1000 units TABS, Take 2,000 Units by mouth daily. ?  potassium chloride SA (KLOR-CON M) 20 MEQ tablet, Take 1 tablet (20 mEq total) by mouth daily for 7 days. ? ? ?Reviewed prior external information including notes and imaging from  ?primary care provider ?As well as notes that were available from care everywhere and other healthcare systems. ? ?Past medical history, social, surgical and family history all reviewed in electronic medical record.  No pertanent information unless stated regarding to the chief complaint.  ? ?Review of Systems: ? No headache, visual changes, nausea, vomiting, diarrhea, constipation, dizziness, abdominal pain, skin rash, fevers, chills, night sweats, weight loss, swollen lymph nodes, body aches, joint swelling, chest pain, shortness of breath, mood changes. POSITIVE muscle aches ? ?Objective  ?Blood pressure 128/62, pulse 64, height 5\' 10"  (1.778 m), weight 206 lb (93.4 kg), SpO2 95 %. ?  ?General: No apparent distress alert and oriented x3 mood and affect normal, dressed appropriately.  ?HEENT: Pupils equal, extraocular movements intact  ?Respiratory: Patient's speak in full sentences and does not appear short of breath  ?Cardiovascular: No lower extremity edema, non tender, no erythema  ?Gait mild antalgic gait favoring the right knee ?MSK: Right knee exam does have some narrowing of the medial joint space.  Tender to palpation over the medial joint space.  Trace effusion noted of the patellofemoral joint.  Lacks last 5 degrees of flexion of the knee. ? ?After informed written and verbal consent, patient was seated on exam table. Right knee was prepped with alcohol swab and utilizing anterolateral approach, patient's right knee space was injected with 4:1  marcaine 0.5%: Kenalog 40mg /dL. Patient tolerated the procedure well without immediate  complications. ? ?  ?Impression and Recommendations:  ?  ? ?The above documentation has been reviewed and is accurate and complete , DO ? ? ? ?

## 2021-06-06 ENCOUNTER — Encounter: Payer: Self-pay | Admitting: Family Medicine

## 2021-06-06 ENCOUNTER — Ambulatory Visit (INDEPENDENT_AMBULATORY_CARE_PROVIDER_SITE_OTHER): Payer: BC Managed Care – PPO

## 2021-06-06 ENCOUNTER — Ambulatory Visit (INDEPENDENT_AMBULATORY_CARE_PROVIDER_SITE_OTHER): Payer: BC Managed Care – PPO | Admitting: Family Medicine

## 2021-06-06 VITALS — BP 128/62 | HR 64 | Ht 70.0 in | Wt 206.0 lb

## 2021-06-06 DIAGNOSIS — M25561 Pain in right knee: Secondary | ICD-10-CM

## 2021-06-06 DIAGNOSIS — M1711 Unilateral primary osteoarthritis, right knee: Secondary | ICD-10-CM

## 2021-06-06 DIAGNOSIS — G8929 Other chronic pain: Secondary | ICD-10-CM

## 2021-06-06 NOTE — Patient Instructions (Signed)
Tart Cherry Extract 1200mg  at night ?Will get approval for gel ?Injected R knee today ?See me again in 6-8 weeks ?

## 2021-06-06 NOTE — Assessment & Plan Note (Signed)
Chronic, with exacerbation.  Given another injection today.  Tolerated the procedure well.  Patient was given a reminder of different exercises that I think will be beneficial.  Has done relatively well with viscosupplementation.  Discussed icing regimen and home exercises.  Follow-up again in 6 to 8 weeks. ?

## 2021-07-11 ENCOUNTER — Other Ambulatory Visit (HOSPITAL_COMMUNITY): Payer: Self-pay

## 2021-07-12 ENCOUNTER — Other Ambulatory Visit (HOSPITAL_COMMUNITY): Payer: Self-pay

## 2021-07-17 NOTE — Progress Notes (Unsigned)
[facility] [address] [place] [address] Phone: [phone] Subjective:   [name], am serving as a scribe for Dr. Antoine Primas.   I'm seeing this patient by the request  of:  Myrlene Broker, MD  CC: Right-sided knee pain follow-up  ION:[id number]  06/06/2021 Chronic, with exacerbation.  Given another injection today.  Tolerated the procedure well.  Patient was given a reminder of different exercises that I think will be beneficial.  Has done relatively well with viscosupplementation.  Discussed icing regimen and home exercises.  Follow-up again in 6 to 8 weeks.  Update 07/18/2021 Kirsten Campbell is a 61 y.o. female coming in with complaint of R knee pain. Patient states that her knee is doing much better after getting injection. Walking every other day instead of every day.        Past Medical History:  Diagnosis Date   Asthma    Chicken pox    Heart murmur    Hyperlipidemia    Hypertension    Past Surgical History:  Procedure Laterality Date   ABDOMINAL HYSTERECTOMY  1995   Social History   Socioeconomic History   Marital status: Married    Spouse name: Not on file   Number of children: Not on file   Years of education: Not on file   Highest education level: Not on file  Occupational History   Not on file  Tobacco Use   Smoking status: Never   Smokeless tobacco: Never  Substance and Sexual Activity   Alcohol use: No   Drug use: No   Sexual activity: Not on file  Other Topics Concern   Not on file  Social History Narrative   Not on file   Social Determinants of Health   Financial Resource Strain: Not on file  Food Insecurity: Not on file  Transportation Needs: Not on file  Physical Activity: Not on file  Stress: Not on file  Social Connections: Not on file   Allergies  Allergen Reactions   Penicillins Hives   Family History  Problem Relation Age of Onset   Cancer Mother        Breast    Hypertension Mother    Breast cancer Mother 75   Hypertension Father    Hypertension Sister    Breast cancer Sister 71   Hypertension Brother    Heart murmur Child     Current Outpatient Medications (Endocrine & Metabolic):    estradiol (ESTRACE) 2 MG tablet, Take 2 mg by mouth daily.   estradiol (ESTRACE) 2 MG tablet, Take 2 tablets (4 mg total) by mouth daily.  Current Outpatient Medications (Cardiovascular):    amLODipine (NORVASC) 10 MG tablet, Take 1 tablet (10 mg total) by mouth daily.   fenofibrate 160 MG tablet, Take 160 mg by mouth daily. Reported on 02/17/2015   losartan-hydrochlorothiazide (HYZAAR) 100-12.5 MG tablet, Take 1 tablet by mouth daily.   pravastatin (PRAVACHOL) 20 MG tablet, Take 1 tablet (20 mg total) by mouth daily.   verapamil (CALAN-SR) 180 MG CR tablet, Take 1 tablet (180 mg total) by mouth daily.  Current Outpatient Medications (Respiratory):    fluticasone (FLONASE) 50 MCG/ACT nasal spray, Place 2 sprays into both nostrils daily.  Current Outpatient Medications (Analgesics):    acetaminophen (TYLENOL) 500 MG tablet, Take 500 mg by mouth 3 (three) times daily.   aspirin 81 MG tablet, Take 81 mg by mouth daily.  Current Outpatient Medications (Hematological):  Ferrous Sulfate (IRON) 325 (65 Fe) MG TABS, Take by mouth. Take one by mouth three times a week.  Current Outpatient Medications (Other):    cyclobenzaprine (FLEXERIL) 10 MG tablet, Take 1 tablet (10mg ) by mouth daily.   gabapentin (NEURONTIN) 400 MG capsule, TAKE 1 CAPSULE BY MOUTH DAILY AT BEDTIME   Multiple Vitamin (MULTIVITAMIN) tablet, Take 1 tablet by mouth daily.   TURMERIC PO, Take by mouth.   Vitamin D, Cholecalciferol, 1000 units TABS, Take 2,000 Units by mouth daily.   potassium chloride SA (KLOR-CON M) 20 MEQ tablet, Take 1 tablet (20 mEq total) by mouth daily for 7 days.   Objective  Blood pressure 136/78, pulse 66, height 5\' 10"  (1.778 m), weight 208 lb (94.3 kg), SpO2 96 %.    General: No apparent distress alert and oriented x3 mood and affect normal, dressed appropriately.  HEENT: Pupils equal, extraocular movements intact  Respiratory: Patient's speak in full sentences and does not appear short of breath  Cardiovascular: No lower extremity edema, non tender, no erythema  Right knee does have some trace effusion noted.  Patient is minimally tender to palpation. Mild instability with valgus and varus force but improvement noted but full range of motion.    Impression and Recommendations:    The above documentation has been reviewed and is accurate and complete , DO

## 2021-07-18 ENCOUNTER — Encounter: Payer: Self-pay | Admitting: Family Medicine

## 2021-07-18 ENCOUNTER — Other Ambulatory Visit (HOSPITAL_COMMUNITY): Payer: Self-pay

## 2021-07-18 ENCOUNTER — Ambulatory Visit: Payer: BC Managed Care – PPO | Admitting: Family Medicine

## 2021-07-18 DIAGNOSIS — M1711 Unilateral primary osteoarthritis, right knee: Secondary | ICD-10-CM

## 2021-07-18 NOTE — Assessment & Plan Note (Signed)
Patient is doing much better after the injection at this moment.  Discussed with patient about the possibility of viscosupplementation.  Patient wants to hold at the moment because feeling much better.  Follow-up with me again as needed

## 2021-07-19 ENCOUNTER — Other Ambulatory Visit (HOSPITAL_COMMUNITY): Payer: Self-pay

## 2021-07-21 ENCOUNTER — Other Ambulatory Visit (HOSPITAL_COMMUNITY): Payer: Self-pay

## 2021-07-24 ENCOUNTER — Other Ambulatory Visit (HOSPITAL_COMMUNITY): Payer: Self-pay

## 2021-07-25 ENCOUNTER — Other Ambulatory Visit (HOSPITAL_COMMUNITY): Payer: Self-pay

## 2021-07-26 ENCOUNTER — Other Ambulatory Visit (HOSPITAL_COMMUNITY): Payer: Self-pay

## 2021-07-28 ENCOUNTER — Other Ambulatory Visit (HOSPITAL_COMMUNITY): Payer: Self-pay

## 2021-08-04 ENCOUNTER — Other Ambulatory Visit (HOSPITAL_COMMUNITY): Payer: Self-pay

## 2021-08-07 ENCOUNTER — Other Ambulatory Visit (HOSPITAL_COMMUNITY): Payer: Self-pay

## 2021-08-08 ENCOUNTER — Other Ambulatory Visit (HOSPITAL_COMMUNITY): Payer: Self-pay

## 2021-08-08 MED ORDER — ESTRADIOL 1 MG PO TABS
1.0000 mg | ORAL_TABLET | Freq: Two times a day (BID) | ORAL | 0 refills | Status: DC
  Filled 2021-08-08: qty 180, 90d supply, fill #0

## 2021-08-29 ENCOUNTER — Other Ambulatory Visit: Payer: Self-pay | Admitting: Internal Medicine

## 2021-08-29 ENCOUNTER — Telehealth: Payer: Self-pay | Admitting: Internal Medicine

## 2021-08-29 MED ORDER — PRAVASTATIN SODIUM 20 MG PO TABS: 20.0000 mg | ORAL_TABLET | Freq: Every day | ORAL | 1 refills | Status: DC

## 2021-08-29 NOTE — Telephone Encounter (Signed)
Rx sent to [facility]

## 2021-08-29 NOTE — Telephone Encounter (Signed)
Caller & Relationship to patient: Noni Stonesifer  Call back number: [phone]  Date of last office visit: 03/07/21  Date of next office visit: 03/08/22  Medication(s) to be refilled:  pravastatin (PRAVACHOL) 20 MG tablet   Preferred Pharmacy:  [facility] [phone] - [place], [place] - [address] AT [facility] [address] Phone:  [phone]  Fax:  [phone]

## 2021-09-12 ENCOUNTER — Other Ambulatory Visit: Payer: Self-pay | Admitting: Obstetrics and Gynecology

## 2021-09-12 DIAGNOSIS — Z1231 Encounter for screening mammogram for malignant neoplasm of breast: Secondary | ICD-10-CM

## 2021-10-18 ENCOUNTER — Telehealth: Payer: Self-pay | Admitting: Family Medicine

## 2021-10-18 ENCOUNTER — Other Ambulatory Visit: Payer: Self-pay

## 2021-10-18 MED ORDER — CYCLOBENZAPRINE HCL 10 MG PO TABS: ORAL_TABLET | ORAL | 0 refills | Status: DC

## 2021-10-18 MED ORDER — GABAPENTIN 400 MG PO CAPS: ORAL_CAPSULE | ORAL | 3 refills | Status: DC

## 2021-10-18 NOTE — Telephone Encounter (Signed)
Medications refilled

## 2021-10-18 NOTE — Telephone Encounter (Signed)
Patient called requesting a refill on gabapentin (NEURONTIN) 400 MG capsule and cyclobenzaprine (FLEXERIL) 10 MG tablet.   Pharmacy: [facility] on [address]

## 2021-10-25 ENCOUNTER — Ambulatory Visit
Admission: RE | Admit: 2021-10-25 | Discharge: 2021-10-25 | Disposition: A | Discharge status: Home / Self Care | Payer: BC Managed Care – PPO | Source: Ambulatory Visit | Attending: Obstetrics and Gynecology | Admitting: Obstetrics and Gynecology

## 2021-10-25 DIAGNOSIS — Z1231 Encounter for screening mammogram for malignant neoplasm of breast: Secondary | ICD-10-CM

## 2022-01-18 ENCOUNTER — Other Ambulatory Visit: Payer: Self-pay | Admitting: Family Medicine

## 2022-02-15 ENCOUNTER — Other Ambulatory Visit: Payer: Self-pay | Admitting: Internal Medicine

## 2022-02-28 ENCOUNTER — Other Ambulatory Visit: Payer: Self-pay | Admitting: Internal Medicine

## 2022-03-08 ENCOUNTER — Encounter: Payer: Self-pay | Admitting: Internal Medicine

## 2022-03-08 ENCOUNTER — Ambulatory Visit (INDEPENDENT_AMBULATORY_CARE_PROVIDER_SITE_OTHER): Payer: BC Managed Care – PPO | Admitting: Internal Medicine

## 2022-03-08 VITALS — BP 120/80 | HR 89 | Temp 97.6°F | Ht 70.0 in | Wt 210.0 lb

## 2022-03-08 DIAGNOSIS — I10 Essential (primary) hypertension: Secondary | ICD-10-CM | POA: Diagnosis not present

## 2022-03-08 DIAGNOSIS — R7301 Impaired fasting glucose: Secondary | ICD-10-CM | POA: Diagnosis not present

## 2022-03-08 DIAGNOSIS — E782 Mixed hyperlipidemia: Secondary | ICD-10-CM

## 2022-03-08 DIAGNOSIS — Z0000 Encounter for general adult medical examination without abnormal findings: Secondary | ICD-10-CM

## 2022-03-08 LAB — COMPREHENSIVE METABOLIC PANEL
ALT: 19 U/L (ref 0–35)
AST: 18 U/L (ref 0–37)
Albumin: 4.1 g/dL (ref 3.5–5.2)
Alkaline Phosphatase: 56 U/L (ref 39–117)
BUN: 9 mg/dL (ref 6–23)
CO2: 28 mEq/L (ref 19–32)
Calcium: 9.5 mg/dL (ref 8.4–10.5)
Chloride: 102 mEq/L (ref 96–112)
Creatinine, Ser: 1 mg/dL (ref 0.40–1.20)
GFR: 59.77 mL/min — ABNORMAL LOW (ref >60.00)
Glucose, Bld: 102 mg/dL — ABNORMAL HIGH (ref 70–99)
Potassium: 3.1 mEq/L — ABNORMAL LOW (ref 3.5–5.1)
Sodium: 141 mEq/L (ref 135–145)
Total Bilirubin: 0.3 mg/dL (ref 0.2–1.2)
Total Protein: 7.3 g/dL (ref 6.0–8.3)

## 2022-03-08 LAB — CBC
HCT: 40.4 % (ref 36.0–46.0)
Hemoglobin: 13.4 g/dL (ref 12.0–15.0)
MCHC: 33.2 g/dL (ref 30.0–36.0)
MCV: 87.8 fl (ref 78.0–100.0)
Platelets: 317 10*3/uL (ref 150.0–400.0)
RBC: 4.61 Mil/uL (ref 3.87–5.11)
RDW: 13.6 % (ref 11.5–15.5)
WBC: 7.6 10*3/uL (ref 4.0–10.5)

## 2022-03-08 LAB — LIPID PANEL
Cholesterol: 213 mg/dL — ABNORMAL HIGH (ref 0–200)
HDL: 66.8 mg/dL (ref >39.00)
LDL Cholesterol: 123 mg/dL — ABNORMAL HIGH (ref 0–99)
NonHDL: 146.56
Total CHOL/HDL Ratio: 3
Triglycerides: 118 mg/dL (ref 0.0–149.0)
VLDL: 23.6 mg/dL (ref 0.0–40.0)

## 2022-03-08 LAB — HEMOGLOBIN A1C: Hgb A1c MFr Bld: 6.3 % (ref 4.6–6.5)

## 2022-03-08 NOTE — Assessment & Plan Note (Signed)
Checking HgA1c and adjust as needed.  

## 2022-03-08 NOTE — Assessment & Plan Note (Signed)
Flu shot up to date. Covid-19 up to date. Shingrix complete. Tetanus up to date. Colonoscopy up to date. Mammogram up to date, pap smear not indicated. Counseled about sun safety and mole surveillance. Counseled about the dangers of distracted driving. Given 10 year screening recommendations.   

## 2022-03-08 NOTE — Assessment & Plan Note (Signed)
BP at goal on amlodipine 10 mg daily and losartan/hctz 100/12.5 mg daily. Checking CBC and CMP and adjust as needed.

## 2022-03-08 NOTE — Assessment & Plan Note (Signed)
Checking lipid panel and adjust pravastatin 20 mg daily as needed. 

## 2022-03-08 NOTE — Progress Notes (Signed)
   Subjective:   Patient ID: Kirsten Campbell, female    DOB: [birth year], 62 y.o.   MRN: [id number]  HPI The patient is here for physical.  PMH, [facility], social history reviewed and updated  Review of Systems  Constitutional: Negative.   HENT: Negative.    Eyes: Negative.   Respiratory:  Negative for cough, chest tightness and shortness of breath.   Cardiovascular:  Negative for chest pain, palpitations and leg swelling.  Gastrointestinal:  Negative for abdominal distention, abdominal pain, constipation, diarrhea, nausea and vomiting.  Musculoskeletal: Negative.   Skin: Negative.   Neurological: Negative.   Psychiatric/Behavioral: Negative.      Objective:  Physical Exam Constitutional:      Appearance: She is well-developed.  HENT:     Head: Normocephalic and atraumatic.  Cardiovascular:     Rate and Rhythm: Normal rate and regular rhythm.  Pulmonary:     Effort: Pulmonary effort is normal. No respiratory distress.     Breath sounds: Normal breath sounds. No wheezing or rales.  Abdominal:     General: Bowel sounds are normal. There is no distension.     Palpations: Abdomen is soft.     Tenderness: There is no abdominal tenderness. There is no rebound.  Musculoskeletal:     Cervical back: Normal range of motion.  Skin:    General: Skin is warm and dry.  Neurological:     Mental Status: She is alert and oriented to person, place, and time.     Coordination: Coordination normal.     Vitals:   03/08/22 0800  BP: 120/80  Pulse: 89  Temp: 97.6 F (36.4 C)  TempSrc: Oral  SpO2: 98%  Weight: 210 lb (95.3 kg)  Height: 5\' 10"  (1.778 m)    Assessment & Plan:

## 2022-03-14 ENCOUNTER — Other Ambulatory Visit: Payer: Self-pay | Admitting: Internal Medicine

## 2022-03-29 ENCOUNTER — Other Ambulatory Visit: Payer: Self-pay | Admitting: Internal Medicine

## 2022-03-31 ENCOUNTER — Other Ambulatory Visit: Payer: Self-pay | Admitting: Internal Medicine

## 2022-04-04 NOTE — Telephone Encounter (Signed)
Patient said that pharmacy did not receive this rx.  Please send again.

## 2022-04-10 ENCOUNTER — Other Ambulatory Visit: Payer: Self-pay | Admitting: Internal Medicine

## 2022-06-02 ENCOUNTER — Other Ambulatory Visit: Payer: Self-pay | Admitting: Internal Medicine

## 2022-06-18 ENCOUNTER — Ambulatory Visit (INDEPENDENT_AMBULATORY_CARE_PROVIDER_SITE_OTHER): Payer: BC Managed Care – PPO

## 2022-06-18 ENCOUNTER — Ambulatory Visit: Payer: BC Managed Care – PPO | Admitting: Family Medicine

## 2022-06-18 VITALS — BP 146/88 | HR 73 | Ht 70.0 in | Wt 211.0 lb

## 2022-06-18 DIAGNOSIS — M545 Low back pain, unspecified: Secondary | ICD-10-CM

## 2022-06-18 MED ORDER — TIZANIDINE HCL 2 MG PO TABS: 2.0000 mg | ORAL_TABLET | Freq: Three times a day (TID) | ORAL | 1 refills | Status: DC | PRN

## 2022-06-18 MED ORDER — HYDROCODONE-ACETAMINOPHEN 5-325 MG PO TABS: 1.0000 | ORAL_TABLET | Freq: Four times a day (QID) | ORAL | 0 refills | Status: DC | PRN

## 2022-06-18 NOTE — Patient Instructions (Addendum)
Thank you for coming in today.   Please get an Xray today before you leave   I've referred you to Physical Therapy.  Let us know if you don't hear from them in one week.   Continue heating pad.   TENS unit.   TENS UNIT: This is helpful for muscle pain and spasm.   Search and Purchase a TENS 7000 2nd edition at  www.tenspros.com or www.Amazon.com It should be less than $30.     TENS unit instructions: Do not shower or bathe with the unit on Turn the unit off before removing electrodes or batteries If the electrodes lose stickiness add a drop of water to the electrodes after they are disconnected from the unit and place on plastic sheet. If you continued to have difficulty, call the TENS unit company to purchase more electrodes. Do not apply lotion on the skin area prior to use. Make sure the skin is clean and dry as this will help prolong the life of the electrodes. After use, always check skin for unusual red areas, rash or other skin difficulties. If there are any skin problems, does not apply electrodes to the same area. Never remove the electrodes from the unit by pulling the wires. Do not use the TENS unit or electrodes other than as directed. Do not change electrode placement without consultating your therapist or physician. Keep 2 fingers with between each electrode. Wear time ratio is 2:1, on to off times.    For example on for 30 minutes off for 15 minutes and then on for 30 minutes off for 15 minutes

## 2022-06-18 NOTE — Progress Notes (Signed)
Rubin Payor, PhD, LAT, ATC acting as a scribe for Clementeen Graham, MD.  Kirsten Campbell is a 62 y.o. female who presents to [facility] at [facility] today for back pain. Pt was previously seen by Dr. Katrinka Blazing on 07/18/21 for R knee OA.  Today, pt c/o back pain ongoing for 1 wk. She lifted heavy bags of potting soil. Pain has been so severe the past 2 days, she's been unable to sleep. Pt locates pain to her L buttock to across both sides of her low back.   Radiating pain: no LE numbness/tingling: no LE weakness: no Aggravates: too acute to determine Treatments tried: flexeril, heating pad  Dx imaging: 11/04/18 L-spine XR  Pertinent review of systems: No fevers or chills  Relevant historical information: Hypertension.  History of lumbosacral radiculopathy at L5.   Exam:  BP (!) 146/88   Pulse 73   Ht 5\' 10"  (1.778 m)   Wt 211 lb (95.7 kg)   SpO2 96%   BMI 30.28 kg/m  General: Well Developed, well nourished, and in no acute distress.   MSK: L-spine: Normal appearing Tender palpation left lumbar paraspinal musculature.  Nontender midline. Decreased lumbar motion.  Lower extremity strength is intact.    Lab and Radiology Results  X-ray images lumbar spine obtained today personally and independently interpreted DDD and facet DJD significant L5-S1.  No acute fractures are visible. Await formal radiology review     Assessment and Plan: 62 y.o. female with acute exacerbation of chronic back pain.  She does have a history of back pain in the past but has been doing pretty well until recently.  She had an acute exacerbation of her back pain occurring less than a week ago after lifting a heavy bag of potting soil.  Pain thought to be due to muscle spasm and dysfunction.  Plan for trial of physical therapy.  In the meantime we will control severe acute pain with hydrocodone.  Will try switching from cyclobenzaprine to tizanidine as it may be more effective.   Recommend also heating pad and TENS unit.   PDMP reviewed during this encounter. Orders Placed This Encounter  Procedures   DG Lumbar Spine 2-3 Views    Standing Status:   Future    Number of Occurrences:   1    Standing Expiration Date:   07/19/2022    Order Specific Question:   Reason for Exam (SYMPTOM  OR DIAGNOSIS REQUIRED)    Answer:   low back pain    Order Specific Question:   Preferred imaging location?    Answer:   [facility]   Ambulatory referral to Physical Therapy    Referral Priority:   Routine    Referral Type:   Physical Medicine    Referral Reason:   Specialty Services Required    Requested Specialty:   Physical Therapy    Number of Visits Requested:   1   Meds ordered this encounter  Medications   HYDROcodone-acetaminophen (NORCO/VICODIN) 5-325 MG tablet    Sig: Take 1 tablet by mouth every 6 (six) hours as needed.    Dispense:  15 tablet    Refill:  0   tiZANidine (ZANAFLEX) 2 MG tablet    Sig: Take 1-2 tablets (2-4 mg total) by mouth every 8 (eight) hours as needed for muscle spasms.    Dispense:  60 tablet    Refill:  1     Discussed warning signs or symptoms. Please see  discharge instructions. Patient expresses understanding.   The above documentation has been reviewed and is accurate and complete Clementeen Graham, M.D.

## 2022-06-22 NOTE — Progress Notes (Signed)
Lumbar spine x-ray shows some arthritis changes.  No fractures are visible.

## 2022-08-27 ENCOUNTER — Ambulatory Visit: Payer: BC Managed Care – PPO | Admitting: Family Medicine

## 2022-08-27 ENCOUNTER — Other Ambulatory Visit: Payer: Self-pay | Admitting: Family Medicine

## 2022-08-27 NOTE — Progress Notes (Deleted)
    I, Stevenson Clinch, CMA acting as a scribe for Clementeen Graham, MD.  Kirsten Campbell is a 62 y.o. female who presents to [facility] at [facility] today for low back pain. Previously seen by Dr. Denyse Amass on 06/18/22 and notes that sx started after lifting a heavy bag of soil. X-ray was ordered, referral placed to PT, and Hydrocodone and Tizanidine prescribed.   Today pt reports ***.    Pertinent review of systems: ***  Relevant historical information: ***   Exam:  There were no vitals taken for this visit. General: Well Developed, well nourished, and in no acute distress.   MSK: ***    Lab and Radiology Results No results found for this or any previous visit (from the past 72 hour(s)). No results found.     Assessment and Plan: 62 y.o. female with ***   PDMP not reviewed this encounter. No orders of the defined types were placed in this encounter.  No orders of the defined types were placed in this encounter.    Discussed warning signs or symptoms. Please see discharge instructions. Patient expresses understanding.   ***

## 2022-08-27 NOTE — Telephone Encounter (Signed)
Patient called stating that she has been having reoccurring back pain. She was originally going to see Dr Denyse Amass but said that she never did the physical therapy he recommended. She is going to contact them to get started on PT. She asked if Dr Denyse Amass would be able to refill her: HYDROcodone-acetaminophen (NORCO/VICODIN) 5-325 MG tablet?   Please advise.

## 2022-08-27 NOTE — Telephone Encounter (Signed)
Last OV 06/18/22 Next OV 10/08/22 with Dr. Katrinka Blazing  Last refill 06/18/22 Qty #15/0  Forwarding to Dr. Denyse Amass.

## 2022-08-30 ENCOUNTER — Other Ambulatory Visit: Payer: Self-pay

## 2022-08-30 ENCOUNTER — Encounter: Payer: Self-pay | Admitting: Family Medicine

## 2022-08-30 ENCOUNTER — Ambulatory Visit: Payer: BC Managed Care – PPO | Admitting: Family Medicine

## 2022-08-30 ENCOUNTER — Ambulatory Visit (INDEPENDENT_AMBULATORY_CARE_PROVIDER_SITE_OTHER): Payer: BC Managed Care – PPO

## 2022-08-30 VITALS — BP 146/88 | HR 73 | Ht 70.0 in | Wt 211.0 lb

## 2022-08-30 DIAGNOSIS — M25551 Pain in right hip: Secondary | ICD-10-CM | POA: Diagnosis not present

## 2022-08-30 DIAGNOSIS — G8929 Other chronic pain: Secondary | ICD-10-CM

## 2022-08-30 DIAGNOSIS — M545 Low back pain, unspecified: Secondary | ICD-10-CM

## 2022-08-30 MED ORDER — HYDROCODONE-ACETAMINOPHEN 5-325 MG PO TABS: 1.0000 | ORAL_TABLET | Freq: Four times a day (QID) | ORAL | 0 refills | Status: DC | PRN

## 2022-08-30 NOTE — Patient Instructions (Addendum)
Thank you for coming in today.   Please get an Xray today before you leave   You received an injection today. Seek immediate medical attention if the joint becomes red, extremely painful, or is oozing fluid.   Let me know where your insurance company accepts for physical therapy

## 2022-08-30 NOTE — Progress Notes (Signed)
Rubin Payor, PhD, LAT, ATC acting as a scribe for Clementeen Graham, MD.  Kirsten Campbell is a 62 y.o. female who presents to [facility] at [facility] today for hip pain. Pt was previously seen by Dr. Denyse Amass on 06/18/22 for LBP.  Today, pt c/o R hip  and low back pain ongoing since Sunday. She fell yesterday when trying to get to the bathroom. Pt locates pain to the R-side of her low back and lateral aspect of her R hip.  She was referred to physical therapy but the [facility] location I referred her to an outside of her network.  She has [employer] state employee health plan.  Radiates: yes-  anterior aspect of R thigh LE numbness/tingling: no LE weakness: yes Aggravates: too acute Treatments tried: Hydrocodone, Tizanidine, Gabapentin  Pertinent review of systems: No fevers or chills  Relevant historical information: Hypertension   Exam:  BP (!) 146/88   Pulse 73   Ht 5\' 10"  (1.778 m)   Wt 211 lb (95.7 kg)   SpO2 96%   BMI 30.28 kg/m  General: Well Developed, well nourished,.  In pain appearing  MSK: L-spine: Normal appearing Nontender palpation spinal midline To palpation right SI joint. Very limited lumbar range of motion. Lower extremity strength is intact but painful. Reflexes are intact.  Right hip: Normal-appearing Tender palpation right SI joint and right greater trochanter. Hip range of motion is intact but painful.  Strength is intact but painful.  Very antalgic gait.  Patient needed assistance to stand from a chair and to maneuver on the exam table.   Lab and Radiology Results  Procedure: Real-time Ultrasound Guided Injection of right SI joint Device: Philips Affiniti 50G/GE Logiq Images permanently stored and available for review in PACS Verbal informed consent obtained.  Discussed risks and benefits of procedure. Warned about infection, bleeding, hyperglycemia damage to structures among others. Patient expresses understanding  and agreement Time-out conducted.   Noted no overlying erythema, induration, or other signs of local infection.   Skin prepped in a sterile fashion.   Local anesthesia: Topical Ethyl chloride.   With sterile technique and under real time ultrasound guidance: 40 mg of Kenalog and 2 mL of Marcaine injected into right SI joint. Fluid seen entering the joint capsule.   Completed without difficulty   Pain moderately  resolved suggesting accurate placement of the medication.   Advised to call if fevers/chills, erythema, induration, drainage, or persistent bleeding.   Images permanently stored and available for review in the ultrasound unit.  Impression: Technically successful ultrasound guided injection.    X-ray images right hip obtained today personally and independently interpreted Mild right hip DJD knee.  No acute fractures are present. Await formal radiology review  EXAM: LUMBAR SPINE - 3 VIEW   COMPARISON:  11/04/2018   FINDINGS: No fracture, dislocation or subluxation. No spondylolisthesis. No osteolytic or osteoblastic changes.   Degenerative disc disease noted with disc space narrowing and marginal osteophytes at L5-S1. Facet joint degenerative changes L3 through S1.   IMPRESSION: Degenerative changes. No acute osseous abnormalities.     Electronically Signed   By: Layla Maw M.D.   On: 06/21/2022 20:07 I, Clementeen Graham, personally (independently) visualized and performed the interpretation of the images attached in this note.    Assessment and Plan: 62 y.o. female with acute exacerbation of right low back pain and lateral hip pain thought to be due to muscle spasm and dysfunction.  She had  a similar but not quite as bad episode in May.  Today we need to get her out of the extreme pain that she is then.  We do SI joint injection and prescribed hydrocodone.  I do want to get her into PT.  It looks like the [facility] locations are [place] to be in network.  I advised her to  contact her insurance company and let me know which locations are in network I am happy to place referral there.   PDMP not reviewed this encounter. Orders Placed This Encounter  Procedures   [place] LIMITED JOINT SPACE STRUCTURES LOW RIGHT(NO LINKED CHARGES)    Order Specific Question:   Reason for Exam (SYMPTOM  OR DIAGNOSIS REQUIRED)    Answer:   SI joint pain    Order Specific Question:   Preferred imaging location?    Answer:   [facility]   DG HIP UNILAT W OR W/O PELVIS 2-3 VIEWS RIGHT    Standing Status:   Future    Number of Occurrences:   1    Standing Expiration Date:   09/30/2022    Order Specific Question:   Reason for Exam (SYMPTOM  OR DIAGNOSIS REQUIRED)    Answer:   right hip pain    Order Specific Question:   Preferred imaging location?    Answer:   [facility]   Meds ordered this encounter  Medications   HYDROcodone-acetaminophen (NORCO/VICODIN) 5-325 MG tablet    Sig: Take 1 tablet by mouth every 6 (six) hours as needed.    Dispense:  15 tablet    Refill:  0     Discussed warning signs or symptoms. Please see discharge instructions. Patient expresses understanding.   The above documentation has been reviewed and is accurate and complete Clementeen Graham, M.D.

## 2022-08-31 ENCOUNTER — Other Ambulatory Visit: Payer: Self-pay

## 2022-08-31 DIAGNOSIS — M545 Low back pain, unspecified: Secondary | ICD-10-CM

## 2022-09-06 NOTE — Progress Notes (Signed)
Bilateral hip arthritis is present.

## 2022-09-12 ENCOUNTER — Other Ambulatory Visit: Payer: Self-pay | Admitting: Obstetrics and Gynecology

## 2022-09-12 DIAGNOSIS — Z1231 Encounter for screening mammogram for malignant neoplasm of breast: Secondary | ICD-10-CM

## 2022-10-05 NOTE — Progress Notes (Unsigned)
[facility] [address] [place] [address] Phone: [phone] Subjective:   Kirsten Campbell, am serving as a scribe for Dr. Antoine Campbell.  I'm seeing this patient by the request  of:  Kirsten Broker, MD  CC: Low back pain follow-up  [id number]  Kirsten Campbell is a 62 y.o. female coming in with complaint of back pain. Last seen for knee pain in June 2023. Has been seeing Dr. Denyse Campbell for back pain with last visit in August 2024. Patient states refill gabapentin. Back is still hurting. Doing PT patient was given an injection in the sacroiliac joint with very minimal if any improvement.  Continues to have discomfort and pain that is fairly severe.  Affecting daily activities.      Past Medical History:  Diagnosis Date   Asthma    Chicken pox    Heart murmur    Hyperlipidemia    Hypertension    Past Surgical History:  Procedure Laterality Date   ABDOMINAL HYSTERECTOMY  1995   Social History   Socioeconomic History   Marital status: Married    Spouse name: Not on file   Number of children: Not on file   Years of education: Not on file   Highest education level: Not on file  Occupational History   Not on file  Tobacco Use   Smoking status: Never   Smokeless tobacco: Never  Substance and Sexual Activity   Alcohol use: No   Drug use: No   Sexual activity: Not on file  Other Topics Concern   Not on file  Social History Narrative   Not on file   Social Determinants of Health   Financial Resource Strain: Not on file  Food Insecurity: Not on file  Transportation Needs: Not on file  Physical Activity: Not on file  Stress: Not on file  Social Connections: Unknown (06/08/2021)   Received from [facility]   Social Network    Social Network: Not on file   Allergies  Allergen Reactions   Penicillins Hives   Family History  Problem Relation Age of Onset   Cancer Mother        Breast   Hypertension  Mother    Breast cancer Mother 17   Hypertension Father    Hypertension Sister    Breast cancer Sister 96   Hypertension Brother    Heart murmur Child     Current Outpatient Medications (Endocrine & Metabolic):    estradiol (ESTRACE) 1 MG tablet, Take 1 tablet (1 mg total) by mouth 2 (two) times daily.   estradiol (ESTRACE) 2 MG tablet, Take 2 mg by mouth daily.  Current Outpatient Medications (Cardiovascular):    amLODipine (NORVASC) 10 MG tablet, TAKE 1 TABLET(10 MG) BY MOUTH DAILY   fenofibrate 160 MG tablet, Take 160 mg by mouth daily. Reported on 02/17/2015   losartan-hydrochlorothiazide (HYZAAR) 100-12.5 MG tablet, TAKE 1 TABLET BY MOUTH DAILY   pravastatin (PRAVACHOL) 20 MG tablet, TAKE 1 TABLET(20 MG) BY MOUTH DAILY   verapamil (CALAN-SR) 180 MG CR tablet, TAKE 1 TABLET(180 MG) BY MOUTH DAILY  Current Outpatient Medications (Respiratory):    fluticasone (FLONASE) 50 MCG/ACT nasal spray, SHAKE LIQUID AND USE 2 SPRAYS IN EACH NOSTRIL DAILY  Current Outpatient Medications (Analgesics):    acetaminophen (TYLENOL) 500 MG tablet, Take 500 mg by mouth 3 (three) times daily.   aspirin 81 MG tablet, Take 81 mg by mouth daily.   HYDROcodone-acetaminophen (NORCO/VICODIN) 5-325  MG tablet, Take 1 tablet by mouth every 6 (six) hours as needed.  Current Outpatient Medications (Hematological):    Ferrous Sulfate (IRON) 325 (65 Fe) MG TABS, Take by mouth. Take one by mouth three times a week.  Current Outpatient Medications (Other):    gabapentin (NEURONTIN) 400 MG capsule, TAKE 1 CAPSULE BY MOUTH DAILY AT BEDTIME   Multiple Vitamin (MULTIVITAMIN) tablet, Take 1 tablet by mouth daily.   tiZANidine (ZANAFLEX) 2 MG tablet, Take 1-2 tablets (2-4 mg total) by mouth every 8 (eight) hours as needed for muscle spasms.   TURMERIC PO, Take by mouth.   Vitamin D, Cholecalciferol, 1000 units TABS, Take 2,000 Units by mouth daily.   Reviewed prior external information including notes and imaging  from  primary care provider As well as notes that were available from care everywhere and other healthcare systems.  Past medical history, social, surgical and family history all reviewed in electronic medical record.  No pertanent information unless stated regarding to the chief complaint.   Review of Systems:  No headache, visual changes, nausea, vomiting, diarrhea, constipation, dizziness, abdominal pain, skin rash, fevers, chills, night sweats, weight loss, swollen lymph nodes, body aches, joint swelling, chest pain, shortness of breath, mood changes. POSITIVE muscle aches  Objective  Blood pressure 120/74, pulse 83, height 5\' 10"  (1.778 m), weight 214 lb (97.1 kg), SpO2 96%.   General: No apparent distress alert and oriented x3 mood and affect normal, dressed appropriately.  HEENT: Pupils equal, extraocular movements intact  Respiratory: Patient's speak in full sentences and does not appear short of breath  Cardiovascular: No lower extremity edema, non tender, no erythema  Low back does have loss of lordosis noted.  Patient does have tightness noted with radicular symptoms in the L5 correspondence.  Seems to be left greater than right.  Patient does have positive radicular symptoms at 20 degrees of forward flexion.    Impression and Recommendations:     The above documentation has been reviewed and is accurate and complete Kirsten Saa, DO

## 2022-10-07 ENCOUNTER — Other Ambulatory Visit: Payer: Self-pay | Admitting: Family Medicine

## 2022-10-08 ENCOUNTER — Encounter: Payer: Self-pay | Admitting: Family Medicine

## 2022-10-08 ENCOUNTER — Ambulatory Visit: Payer: BC Managed Care – PPO | Admitting: Family Medicine

## 2022-10-08 VITALS — BP 120/74 | HR 83 | Ht 70.0 in | Wt 214.0 lb

## 2022-10-08 DIAGNOSIS — M5417 Radiculopathy, lumbosacral region: Secondary | ICD-10-CM

## 2022-10-08 DIAGNOSIS — M545 Low back pain, unspecified: Secondary | ICD-10-CM | POA: Diagnosis not present

## 2022-10-08 NOTE — Patient Instructions (Signed)
Imaging [phone] Call Today  When we receive your results we will contact you.  

## 2022-10-08 NOTE — Assessment & Plan Note (Signed)
Has had difficulty with this previously.  Do feel that new imaging is warranted at this time with patient already failing formal physical therapy, medications, icing regimen, as well as gabapentin.  Encourage patient to increase certain activities.  Will get the MRI and further evaluation otherwise.  Depending on findings could be a candidate for another injection.  Has had cyst formations previously in the back that we may need to monitor as well.

## 2022-10-22 ENCOUNTER — Telehealth: Payer: Self-pay | Admitting: Family Medicine

## 2022-10-22 NOTE — Telephone Encounter (Signed)
Patient is scheduled for her MRI on 10/10 (but the authorization will expire on 10/8).  Can this be re-authorized after 8th so that she can keep her 10/10 appointment?

## 2022-10-29 ENCOUNTER — Ambulatory Visit
Admission: RE | Admit: 2022-10-29 | Discharge: 2022-10-29 | Disposition: A | Discharge status: Home / Self Care | Payer: BC Managed Care – PPO | Source: Ambulatory Visit | Attending: Obstetrics and Gynecology | Admitting: Obstetrics and Gynecology

## 2022-10-29 DIAGNOSIS — Z1231 Encounter for screening mammogram for malignant neoplasm of breast: Secondary | ICD-10-CM

## 2022-11-07 ENCOUNTER — Encounter: Payer: Self-pay | Admitting: Family Medicine

## 2022-11-07 NOTE — Telephone Encounter (Signed)
Done

## 2022-11-08 ENCOUNTER — Ambulatory Visit
Admission: RE | Admit: 2022-11-08 | Discharge: 2022-11-08 | Disposition: A | Discharge status: Home / Self Care | Payer: BC Managed Care – PPO | Source: Ambulatory Visit | Attending: Family Medicine | Admitting: Family Medicine

## 2022-11-08 DIAGNOSIS — M545 Low back pain, unspecified: Secondary | ICD-10-CM

## 2022-11-13 ENCOUNTER — Other Ambulatory Visit (HOSPITAL_COMMUNITY): Payer: Self-pay

## 2022-11-14 LAB — HM COLONOSCOPY

## 2022-11-20 ENCOUNTER — Encounter: Payer: Self-pay | Admitting: Internal Medicine

## 2022-11-21 NOTE — Telephone Encounter (Signed)
Please advise as Md is out office

## 2022-11-26 ENCOUNTER — Encounter: Payer: Self-pay | Admitting: Family Medicine

## 2022-11-27 ENCOUNTER — Other Ambulatory Visit: Payer: Self-pay

## 2022-11-27 DIAGNOSIS — M5416 Radiculopathy, lumbar region: Secondary | ICD-10-CM

## 2022-12-01 ENCOUNTER — Other Ambulatory Visit: Payer: Self-pay | Admitting: Internal Medicine

## 2022-12-04 NOTE — Discharge Instructions (Addendum)
Post Procedure Spinal Discharge Instruction Sheet  You may resume a regular diet and any medications that you routinely take (including pain medications) unless otherwise noted by MD.  No driving day of procedure.  Light activity throughout the rest of the day.  Do not do any strenuous work, exercise, bending or lifting.  The day following the procedure, you can resume normal physical activity but you should refrain from exercising or physical therapy for at least three days thereafter.  You may apply ice to the injection site, 20 minutes on, 20 minutes off, as needed. Do not apply ice directly to skin.    Common Side Effects:  Headaches- take your usual medications as directed by your physician.  Increase your fluid intake.  Caffeinated beverages may be helpful.  Lie flat in bed until your headache resolves.  Restlessness or inability to sleep- you may have trouble sleeping for the next few days.  Ask your referring physician if you need any medication for sleep.  Facial flushing or redness- should subside within a few days.  Increased pain- a temporary increase in pain a day or two following your procedure is not unusual.  Take your pain medication as prescribed by your referring physician.  Leg cramps  Please contact our office at [phone] for the following symptoms: Fever greater than 100 degrees. Headaches unresolved with medication after 2-3 days. Increased swelling, pain, or redness at injection site.   Thank you for visiting [facility] today.    YOU MAY RESUME YOUR ASPIRIN TODAY, POST PROCEDURE.

## 2022-12-05 ENCOUNTER — Ambulatory Visit
Admission: RE | Admit: 2022-12-05 | Discharge: 2022-12-05 | Disposition: A | Discharge status: Home / Self Care | Payer: BC Managed Care – PPO | Source: Ambulatory Visit | Attending: Family Medicine | Admitting: Family Medicine

## 2022-12-05 DIAGNOSIS — M5416 Radiculopathy, lumbar region: Secondary | ICD-10-CM

## 2022-12-05 MED ORDER — IOPAMIDOL (ISOVUE-M 200) INJECTION 41%
1.0000 mL | Freq: Once | INTRAMUSCULAR | Status: AC
  Administered 2022-12-05: 1 mL via EPIDURAL

## 2022-12-05 MED ORDER — METHYLPREDNISOLONE ACETATE 40 MG/ML INJ SUSP (RADIOLOG
80.0000 mg | Freq: Once | INTRAMUSCULAR | Status: AC
  Administered 2022-12-05: 80 mg via EPIDURAL

## 2023-01-15 NOTE — Progress Notes (Unsigned)
[facility] [address] [place] [address] Phone: [phone] Subjective:   Kirsten Campbell, am serving as a scribe for Dr. Antoine Primas.  I'm seeing this patient by the request  of:  Myrlene Broker, MD  CC: Low back pain follow-up  [id number]  10/08/2022 Has had difficulty with this previously.  Do feel that new imaging is warranted at this time with patient already failing formal physical therapy, medications, icing regimen, as well as gabapentin.  Encourage patient to increase certain activities.  Will get the MRI and further evaluation otherwise.  Depending on findings could be a candidate for another injection.  Has had cyst formations previously in the back that we may need to monitor as well.      Update 01/16/2023 Kirsten Campbell is a 62 y.o. female coming in with complaint of lumbar spine pain. Epidural 12/05/2022. Patient states that the epidural was helpful. Has much less pain and has not been taking pain medication.     Last MRI in October 2024 showed but patient does have moderate spinal stenosis at L3-L4 that has some some progression since 2016.  L4-L5 epidural was done November 6.  Past Medical History:  Diagnosis Date   Asthma    Chicken pox    Heart murmur    Hyperlipidemia    Hypertension    Past Surgical History:  Procedure Laterality Date   ABDOMINAL HYSTERECTOMY  1995   Social History   Socioeconomic History   Marital status: Married    Spouse name: Not on file   Number of children: Not on file   Years of education: Not on file   Highest education level: Not on file  Occupational History   Not on file  Tobacco Use   Smoking status: Never   Smokeless tobacco: Never  Substance and Sexual Activity   Alcohol use: No   Drug use: No   Sexual activity: Not on file  Other Topics Concern   Not on file  Social History Narrative   Not on file   Social Drivers of Health   Financial Resource Strain:  Not on file  Food Insecurity: Not on file  Transportation Needs: Not on file  Physical Activity: Not on file  Stress: Not on file  Social Connections: Unknown (06/08/2021)   Received from [facility]   Social Network    Social Network: Not on file   Allergies  Allergen Reactions   Penicillins Hives   Family History  Problem Relation Age of Onset   Cancer Mother        Breast   Hypertension Mother    Breast cancer Mother 53   Hypertension Father    Hypertension Sister    Breast cancer Sister 31   Hypertension Brother    Heart murmur Child     Current Outpatient Medications (Endocrine & Metabolic):    estradiol (ESTRACE) 1 MG tablet, Take 1 tablet (1 mg total) by mouth 2 (two) times daily.   estradiol (ESTRACE) 2 MG tablet, Take 2 mg by mouth daily.  Current Outpatient Medications (Cardiovascular):    amLODipine (NORVASC) 10 MG tablet, TAKE 1 TABLET(10 MG) BY MOUTH DAILY   fenofibrate 160 MG tablet, Take 160 mg by mouth daily. Reported on 02/17/2015   losartan-hydrochlorothiazide (HYZAAR) 100-12.5 MG tablet, TAKE 1 TABLET BY MOUTH DAILY   pravastatin (PRAVACHOL) 20 MG tablet, TAKE 1 TABLET(20 MG) BY MOUTH DAILY   verapamil (CALAN-SR) 180 MG CR  tablet, TAKE 1 TABLET(180 MG) BY MOUTH DAILY  Current Outpatient Medications (Respiratory):    fluticasone (FLONASE) 50 MCG/ACT nasal spray, SHAKE LIQUID AND USE 2 SPRAYS IN EACH NOSTRIL DAILY  Current Outpatient Medications (Analgesics):    acetaminophen (TYLENOL) 500 MG tablet, Take 500 mg by mouth 3 (three) times daily.   aspirin 81 MG tablet, Take 81 mg by mouth daily.   HYDROcodone-acetaminophen (NORCO/VICODIN) 5-325 MG tablet, Take 1 tablet by mouth every 6 (six) hours as needed.  Current Outpatient Medications (Hematological):    Ferrous Sulfate (IRON) 325 (65 Fe) MG TABS, Take by mouth. Take one by mouth three times a week.  Current Outpatient Medications (Other):    gabapentin (NEURONTIN) 400 MG capsule,  TAKE 1 CAPSULE BY MOUTH DAILY AT BEDTIME   Multiple Vitamin (MULTIVITAMIN) tablet, Take 1 tablet by mouth daily.   tiZANidine (ZANAFLEX) 2 MG tablet, Take 1-2 tablets (2-4 mg total) by mouth every 8 (eight) hours as needed for muscle spasms.   TURMERIC PO, Take by mouth.   Vitamin D, Cholecalciferol, 1000 units TABS, Take 2,000 Units by mouth daily.   Reviewed prior external information including notes and imaging from  primary care provider As well as notes that were available from care everywhere and other healthcare systems.  Past medical history, social, surgical and family history all reviewed in electronic medical record.  No pertanent information unless stated regarding to the chief complaint.   Review of Systems:  No headache, visual changes, nausea, vomiting, diarrhea, constipation, dizziness, abdominal pain, skin rash, fevers, chills, night sweats, weight loss, swollen lymph nodes, body aches, joint swelling, chest pain, shortness of breath, mood changes. POSITIVE muscle aches  Objective  Blood pressure 128/84, height 5\' 10"  (1.778 m), weight 218 lb (98.9 kg).   General: No apparent distress alert and oriented x3 mood and affect normal, dressed appropriately.  HEENT: Pupils equal, extraocular movements intact  Respiratory: Patient's speak in full sentences and does not appear short of breath  Cardiovascular: No lower extremity edema, non tender, no erythema  Sitting extremely comfortable.  Able to get up from a sitting position without any difficulty, negative straight leg test at the moment.  Mild tightness with FABER but no radicular symptoms.    Impression and Recommendations:     The above documentation has been reviewed and is accurate and complete Judi Saa, DO

## 2023-01-16 ENCOUNTER — Ambulatory Visit: Payer: BC Managed Care – PPO | Admitting: Family Medicine

## 2023-01-16 ENCOUNTER — Encounter: Payer: Self-pay | Admitting: Family Medicine

## 2023-01-16 VITALS — BP 128/84 | Ht 70.0 in | Wt 218.0 lb

## 2023-01-16 DIAGNOSIS — M5417 Radiculopathy, lumbosacral region: Secondary | ICD-10-CM

## 2023-01-16 NOTE — Assessment & Plan Note (Signed)
Significant improvement after the epidural at this time.  Patient is feeling 95% better.  Encouraged her to increase activity as tolerated.  Can follow-up with me more on an as-needed basis.  We did discuss that if worsening symptoms we will consider the possibility of repeating the epidural if necessary.

## 2023-02-28 ENCOUNTER — Other Ambulatory Visit: Payer: Self-pay | Admitting: Internal Medicine

## 2023-03-02 ENCOUNTER — Other Ambulatory Visit: Payer: Self-pay | Admitting: Internal Medicine

## 2023-03-11 ENCOUNTER — Encounter: Payer: Self-pay | Admitting: Internal Medicine

## 2023-03-11 ENCOUNTER — Ambulatory Visit (INDEPENDENT_AMBULATORY_CARE_PROVIDER_SITE_OTHER): Payer: 59 | Admitting: Internal Medicine

## 2023-03-11 ENCOUNTER — Telehealth: Payer: Self-pay | Admitting: Internal Medicine

## 2023-03-11 VITALS — BP 140/86 | HR 64 | Temp 97.8°F | Ht 70.0 in | Wt 217.0 lb

## 2023-03-11 DIAGNOSIS — R7301 Impaired fasting glucose: Secondary | ICD-10-CM

## 2023-03-11 DIAGNOSIS — E782 Mixed hyperlipidemia: Secondary | ICD-10-CM | POA: Diagnosis not present

## 2023-03-11 DIAGNOSIS — Z0000 Encounter for general adult medical examination without abnormal findings: Secondary | ICD-10-CM | POA: Diagnosis not present

## 2023-03-11 DIAGNOSIS — I10 Essential (primary) hypertension: Secondary | ICD-10-CM

## 2023-03-11 LAB — CBC
HCT: 40.6 % (ref 36.0–46.0)
Hemoglobin: 13.2 g/dL (ref 12.0–15.0)
MCHC: 32.5 g/dL (ref 30.0–36.0)
MCV: 88.1 fL (ref 78.0–100.0)
Platelets: 314 10*3/uL (ref 150.0–400.0)
RBC: 4.61 Mil/uL (ref 3.87–5.11)
RDW: 13.6 % (ref 11.5–15.5)
WBC: 8.2 10*3/uL (ref 4.0–10.5)

## 2023-03-11 LAB — COMPREHENSIVE METABOLIC PANEL
ALT: 19 U/L (ref 0–35)
AST: 17 U/L (ref 0–37)
Albumin: 4 g/dL (ref 3.5–5.2)
Alkaline Phosphatase: 58 U/L (ref 39–117)
BUN: 9 mg/dL (ref 6–23)
CO2: 29 meq/L (ref 19–32)
Calcium: 9.5 mg/dL (ref 8.4–10.5)
Chloride: 103 meq/L (ref 96–112)
Creatinine, Ser: 0.92 mg/dL (ref 0.40–1.20)
GFR: 65.6 mL/min (ref >60.00)
Glucose, Bld: 117 mg/dL — ABNORMAL HIGH (ref 70–99)
Potassium: 3.3 meq/L — ABNORMAL LOW (ref 3.5–5.1)
Sodium: 140 meq/L (ref 135–145)
Total Bilirubin: 0.3 mg/dL (ref 0.2–1.2)
Total Protein: 7.1 g/dL (ref 6.0–8.3)

## 2023-03-11 LAB — LIPID PANEL
Cholesterol: 193 mg/dL (ref 0–200)
HDL: 68.4 mg/dL (ref >39.00)
LDL Cholesterol: 95 mg/dL (ref 0–99)
NonHDL: 124.41
Total CHOL/HDL Ratio: 3
Triglycerides: 146 mg/dL (ref 0.0–149.0)
VLDL: 29.2 mg/dL (ref 0.0–40.0)

## 2023-03-11 LAB — HEMOGLOBIN A1C: Hgb A1c MFr Bld: 6.7 % — ABNORMAL HIGH (ref 4.6–6.5)

## 2023-03-11 MED ORDER — VERAPAMIL HCL ER 180 MG PO TBCR: 180.0000 mg | EXTENDED_RELEASE_TABLET | Freq: Every day | ORAL | 3 refills | Status: AC

## 2023-03-11 MED ORDER — PRAVASTATIN SODIUM 20 MG PO TABS: 20.0000 mg | ORAL_TABLET | Freq: Every day | ORAL | 3 refills | Status: AC

## 2023-03-11 MED ORDER — AMLODIPINE BESYLATE 10 MG PO TABS: 10.0000 mg | ORAL_TABLET | Freq: Every day | ORAL | 3 refills | Status: AC

## 2023-03-11 MED ORDER — FLUTICASONE PROPIONATE 50 MCG/ACT NA SUSP: 2.0000 | Freq: Every day | NASAL | 6 refills | Status: AC

## 2023-03-11 MED ORDER — LOSARTAN POTASSIUM-HCTZ 100-12.5 MG PO TABS: 1.0000 | ORAL_TABLET | Freq: Every day | ORAL | 3 refills | Status: AC

## 2023-03-11 NOTE — Patient Instructions (Signed)
 I would recommend to get the pneumonia vaccine at or after 65.

## 2023-03-11 NOTE — Telephone Encounter (Signed)
 Please advise for EKG

## 2023-03-11 NOTE — Progress Notes (Signed)
   Subjective:   Patient ID: Kirsten Campbell, female    DOB: 04/14/1960, 63 y.o.   MRN: [id number]  HPI The patient is here for physical.  PMH, [facility], social history reviewed and updated  Review of Systems  Constitutional: Negative.   HENT: Negative.    Eyes: Negative.   Respiratory:  Negative for cough, chest tightness and shortness of breath.   Cardiovascular:  Negative for chest pain, palpitations and leg swelling.  Gastrointestinal:  Negative for abdominal distention, abdominal pain, constipation, diarrhea, nausea and vomiting.  Musculoskeletal: Negative.   Skin: Negative.   Neurological: Negative.   Psychiatric/Behavioral: Negative.      Objective:  Physical Exam Constitutional:      Appearance: She is well-developed.  HENT:     Head: Normocephalic and atraumatic.  Cardiovascular:     Rate and Rhythm: Normal rate and regular rhythm.  Pulmonary:     Effort: Pulmonary effort is normal. No respiratory distress.     Breath sounds: Normal breath sounds. No wheezing or rales.  Abdominal:     General: Bowel sounds are normal. There is no distension.     Palpations: Abdomen is soft.     Tenderness: There is no abdominal tenderness. There is no rebound.  Musculoskeletal:     Cervical back: Normal range of motion.  Skin:    General: Skin is warm and dry.  Neurological:     Mental Status: She is alert and oriented to person, place, and time.     Coordination: Coordination normal.     Vitals:   03/11/23 0808 03/11/23 0810  BP: (!) 140/86 (!) 140/86  Pulse: 64   Temp: 97.8 F (36.6 C)   TempSrc: Oral   SpO2: 94%   Weight: 217 lb (98.4 kg)   Height: 5\' 10"  (1.778 m)     Assessment & Plan:

## 2023-03-11 NOTE — Telephone Encounter (Signed)
 She was advised to schedule welcome to medicare we were discussing how an EKG is part of that. Please schedule

## 2023-03-11 NOTE — Telephone Encounter (Signed)
Copied from CRM [id number]. Topic: Clinical - Medical Advice >> Mar 11, 2023 10:47 AM Fredrich Romans wrote: Reason for CRM: patient was advised to have a EKG done due to her being under medicare insurance now patient's would like to know around one time should she get scheduled to have it done?  Patient would also like to make sure her medication are being filled at a 90 increments?

## 2023-03-14 NOTE — Progress Notes (Signed)
Patient does not want to speak with a nutritionist  at the time and states that she would lie to work on it herself

## 2023-03-15 NOTE — Assessment & Plan Note (Signed)
Checking HGA1c and adjust as needed.

## 2023-03-15 NOTE — Assessment & Plan Note (Signed)
Checking lipid panel and adjust pravastatin as needed.

## 2023-03-15 NOTE — Assessment & Plan Note (Signed)
BP at goal on amlodipine 10 mg daily and losartan/hydrochlorothiazide 100/12.5 mg daily checking CMP and adjust as needed.

## 2023-03-15 NOTE — Assessment & Plan Note (Signed)
Flu shot up to date. Pneumonia complete. Shingrix complete. Tetanus up to date. Colonoscopy up to date. Mammogram up to date, pap smear aged out and dexa up to date. Counseled about sun safety and mole surveillance. Counseled about the dangers of distracted driving. Given 10 year screening recommendations.

## 2023-04-05 ENCOUNTER — Other Ambulatory Visit: Payer: Self-pay | Admitting: Internal Medicine

## 2023-05-30 ENCOUNTER — Other Ambulatory Visit (HOSPITAL_COMMUNITY): Payer: Self-pay

## 2023-05-31 ENCOUNTER — Other Ambulatory Visit (HOSPITAL_COMMUNITY): Payer: Self-pay

## 2023-06-03 ENCOUNTER — Other Ambulatory Visit: Payer: Self-pay

## 2023-06-03 ENCOUNTER — Telehealth: Payer: Self-pay | Admitting: Family Medicine

## 2023-06-03 MED ORDER — CYCLOBENZAPRINE HCL 10 MG PO TABS: ORAL_TABLET | ORAL | 0 refills | Status: DC

## 2023-06-03 NOTE — Telephone Encounter (Signed)
 Patient called requesting a refill on: tramadol  50mg   cyclobenzabrine 10mg    Pharmacy: Wilmer Hash at [facility]

## 2023-06-04 MED ORDER — TRAMADOL HCL 50 MG PO TABS: 50.0000 mg | ORAL_TABLET | Freq: Three times a day (TID) | ORAL | 0 refills | Status: AC | PRN

## 2023-06-05 ENCOUNTER — Telehealth: Payer: Self-pay | Admitting: Pharmacy Technician

## 2023-06-05 ENCOUNTER — Encounter (HOSPITAL_COMMUNITY): Payer: Self-pay

## 2023-06-05 ENCOUNTER — Encounter: Payer: Self-pay | Admitting: Pharmacy Technician

## 2023-06-05 ENCOUNTER — Other Ambulatory Visit (HOSPITAL_COMMUNITY): Payer: Self-pay

## 2023-06-05 NOTE — Telephone Encounter (Signed)
 Pharmacy Patient Advocate Encounter   Received notification from CoverMyMeds that prior authorization for cyclobenzaprine  is required/requested.   Insurance verification completed.   The patient is insured through [facility] ADVANTAGE/RX ADVANCE .   Per test claim: PA required; PA submitted to above mentioned insurance via CoverMyMeds Key/confirmation #/EOC [facility] Status is pending

## 2023-06-05 NOTE — Telephone Encounter (Signed)
 error

## 2023-06-06 ENCOUNTER — Other Ambulatory Visit (HOSPITAL_COMMUNITY): Payer: Self-pay

## 2023-06-06 NOTE — Telephone Encounter (Signed)
 Pharmacy Patient Advocate Encounter  Received notification from [employer] that Prior Authorization for cyclobenzaprine  has been APPROVED from 06/05/23 to 07/03/23. Ran test claim, Copay is $2.15- 3 months. This test claim was processed through [facility]- copay amounts may vary at other pharmacies due to pharmacy/plan contracts, or as the patient moves through the different stages of their insurance plan.   PA #/Case ID/Reference #: [id number]  Will need another PA before next fill is available.

## 2023-06-11 ENCOUNTER — Ambulatory Visit: Payer: 59 | Admitting: Internal Medicine

## 2023-06-12 ENCOUNTER — Encounter: Payer: Self-pay | Admitting: Internal Medicine

## 2023-06-12 ENCOUNTER — Ambulatory Visit (INDEPENDENT_AMBULATORY_CARE_PROVIDER_SITE_OTHER): Admitting: Internal Medicine

## 2023-06-12 ENCOUNTER — Telehealth: Payer: Self-pay | Admitting: Internal Medicine

## 2023-06-12 VITALS — BP 114/86 | HR 68 | Temp 97.4°F | Ht 70.0 in | Wt 206.0 lb

## 2023-06-12 DIAGNOSIS — E119 Type 2 diabetes mellitus without complications: Secondary | ICD-10-CM

## 2023-06-12 LAB — MICROALBUMIN / CREATININE URINE RATIO
Creatinine,U: 54.6 mg/dL
Microalb Creat Ratio: UNDETERMINED mg/g (ref 0.0–30.0)
Microalb, Ur: <0.7 mg/dL

## 2023-06-12 LAB — POCT GLYCOSYLATED HEMOGLOBIN (HGB A1C): HbA1c POC (<> result, manual entry): 6 % (ref 4.0–5.6)

## 2023-06-12 NOTE — Telephone Encounter (Signed)
 Patient states that she is not taking the estradiol  2 mg.  She only take the 1 mg.  She also does not take the tizanidine  - please take these off her medication list.

## 2023-06-12 NOTE — Progress Notes (Signed)
   Subjective:   Patient ID: Kirsten Campbell, female    DOB: 03/13/1960, 63 y.o.   MRN: [id number]  HPI The patient is a 63 YO female with newly diagnosed diabetes. She has made significant changes with giving up candy and sodas and is down 10 pounds.  Review of Systems  Constitutional: Negative.   HENT: Negative.    Eyes: Negative.   Respiratory:  Negative for cough, chest tightness and shortness of breath.   Cardiovascular:  Negative for chest pain, palpitations and leg swelling.  Gastrointestinal:  Negative for abdominal distention, abdominal pain, constipation, diarrhea, nausea and vomiting.  Musculoskeletal: Negative.   Skin: Negative.   Neurological: Negative.   Psychiatric/Behavioral: Negative.      Objective:  Physical Exam Constitutional:      Appearance: She is well-developed.  HENT:     Head: Normocephalic and atraumatic.  Cardiovascular:     Rate and Rhythm: Normal rate and regular rhythm.  Pulmonary:     Effort: Pulmonary effort is normal. No respiratory distress.     Breath sounds: Normal breath sounds. No wheezing or rales.  Abdominal:     General: Bowel sounds are normal. There is no distension.     Palpations: Abdomen is soft.     Tenderness: There is no abdominal tenderness. There is no rebound.  Musculoskeletal:     Cervical back: Normal range of motion.  Skin:    General: Skin is warm and dry.     Comments: Foot exam done  Neurological:     Mental Status: She is alert and oriented to person, place, and time.     Coordination: Coordination normal.     Vitals:   06/12/23 0842  BP: 114/86  Pulse: 68  Temp: (!) 97.4 F (36.3 C)  TempSrc: Oral  SpO2: 98%  Weight: 206 lb (93.4 kg)  Height: 5\' 10"  (1.778 m)    Assessment & Plan:

## 2023-06-12 NOTE — Patient Instructions (Signed)
 We will recheck the sugars in 6 months

## 2023-06-12 NOTE — Assessment & Plan Note (Signed)
 POC HgA1c done and 6.0 today. Follow up 6 months and if persistently improved may change dx back to pre-diabetes. Foot exam done and checking UACR. Reminded about yearly eye exam which she has had already for the year.

## 2023-06-12 NOTE — Telephone Encounter (Signed)
 OK to remove or adjust?

## 2023-06-14 ENCOUNTER — Ambulatory Visit: Payer: Self-pay | Admitting: Internal Medicine

## 2023-06-14 NOTE — Telephone Encounter (Signed)
Ok to adjust

## 2023-06-19 ENCOUNTER — Other Ambulatory Visit: Payer: Self-pay

## 2023-06-19 MED ORDER — ESTRADIOL 2 MG PO TABS: 1.0000 mg | ORAL_TABLET | Freq: Every day | ORAL | 0 refills | Status: DC

## 2023-06-19 NOTE — Telephone Encounter (Signed)
This has been adjusted

## 2023-07-01 DIAGNOSIS — N951 Menopausal and female climacteric states: Secondary | ICD-10-CM | POA: Diagnosis not present

## 2023-07-01 DIAGNOSIS — Z6835 Body mass index (BMI) 35.0-35.9, adult: Secondary | ICD-10-CM | POA: Diagnosis not present

## 2023-07-01 DIAGNOSIS — Z01419 Encounter for gynecological examination (general) (routine) without abnormal findings: Secondary | ICD-10-CM | POA: Diagnosis not present

## 2023-07-01 DIAGNOSIS — N760 Acute vaginitis: Secondary | ICD-10-CM | POA: Diagnosis not present

## 2023-07-03 ENCOUNTER — Other Ambulatory Visit: Payer: Self-pay

## 2023-09-16 ENCOUNTER — Other Ambulatory Visit: Payer: Self-pay | Admitting: Obstetrics and Gynecology

## 2023-09-16 DIAGNOSIS — Z1231 Encounter for screening mammogram for malignant neoplasm of breast: Secondary | ICD-10-CM

## 2023-09-25 NOTE — Progress Notes (Unsigned)
 [facility] [address] [place] [address] Phone: [phone] Subjective:   Kirsten Campbell, am serving as a scribe for Dr. Arthea Claudene.  I'm seeing this patient by the request  of:  Rollene Almarie LABOR, MD  CC: Right shoulder pain, low back pain  YEP:Dlagzrupcz  01/16/2023 Significant improvement after the epidural at this time. Patient is feeling 95% better. Encouraged her to increase activity as tolerated. Can follow-up with me more on an as-needed basis. We did discuss that if worsening symptoms we will consider the possibility of repeating the epidural if necessary.   Update 09/26/2023 Kirsten Campbell is a 63 y.o. female coming in with complaint of lumbar spine and shoulder pain. Patient states that she is doing well. Stretches when back gets tight. Shoulder is doing well.   Uses flexeril  and gabapentin .        Past Medical History:  Diagnosis Date   Allergy    Arthritis    Asthma    Chicken pox    Heart murmur    Hyperlipidemia    Hypertension    Past Surgical History:  Procedure Laterality Date   ABDOMINAL HYSTERECTOMY  01/29/1993   CESAREAN SECTION     Social History   Socioeconomic History   Marital status: Married    Spouse name: Not on file   Number of children: Not on file   Years of education: Not on file   Highest education level: Associate degree: academic program  Occupational History   Not on file  Tobacco Use   Smoking status: Never   Smokeless tobacco: Never  Substance and Sexual Activity   Alcohol use: No   Drug use: No   Sexual activity: Yes    Birth control/protection: None  Other Topics Concern   Not on file  Social History Narrative   Not on file   Social Drivers of Health   Financial Resource Strain: Low Risk  (06/08/2023)   Overall Financial Resource Strain (CARDIA)    Difficulty of Paying Living Expenses: Not hard at all  Food Insecurity: No Food Insecurity (06/08/2023)   Hunger  Vital Sign    Worried About Running Out of Food in the Last Year: Never true    Ran Out of Food in the Last Year: Never true  Transportation Needs: No Transportation Needs (06/08/2023)   PRAPARE - Administrator, Civil Service (Medical): No    Lack of Transportation (Non-Medical): No  Physical Activity: Sufficiently Active (06/08/2023)   Exercise Vital Sign    Days of Exercise per Week: 5 days    Minutes of Exercise per Session: 50 min  Stress: No Stress Concern Present (06/08/2023)   [employer] of Occupational Health - Occupational Stress Questionnaire    Feeling of Stress : Only a little  Social Connections: Socially Integrated (06/08/2023)   Social Connection and Isolation Panel    Frequency of Communication with Friends and Family: More than three times a week    Frequency of Social Gatherings with Friends and Family: More than three times a week    Attends Religious Services: More than 4 times per year    Active Member of [employer] or Organizations: Yes    Attends Engineer, structural: More than 4 times per year    Marital Status: Married   Allergies  Allergen Reactions   Penicillins Hives   Family History  Problem Relation Age of Onset   Cancer Mother  Breast   Hypertension Mother    Breast cancer Mother 50   Hypertension Father    Hypertension Sister    Breast cancer Sister 16   Hypertension Brother    Heart murmur Child     Current Outpatient Medications (Endocrine & Metabolic):    estradiol  (ESTRACE ) 1 MG tablet, Take 1 tablet (1 mg total) by mouth 2 (two) times daily.   estradiol  (ESTRACE ) 2 MG tablet, Take 0.5 tablets (1 mg total) by mouth daily.  Current Outpatient Medications (Cardiovascular):    amLODipine  (NORVASC ) 10 MG tablet, Take 1 tablet (10 mg total) by mouth daily.   fenofibrate 160 MG tablet, Take 160 mg by mouth daily. Reported on 02/17/2015   losartan -hydrochlorothiazide  (HYZAAR) 100-12.5 MG tablet, Take 1 tablet by mouth  daily.   pravastatin  (PRAVACHOL ) 20 MG tablet, Take 1 tablet (20 mg total) by mouth daily.   verapamil  (CALAN -SR) 180 MG CR tablet, Take 1 tablet (180 mg total) by mouth daily.  Current Outpatient Medications (Respiratory):    fluticasone  (FLONASE ) 50 MCG/ACT nasal spray, Place 2 sprays into both nostrils daily.  Current Outpatient Medications (Analgesics):    acetaminophen  (TYLENOL ) 500 MG tablet, Take 500 mg by mouth 3 (three) times daily.   aspirin 81 MG tablet, Take 81 mg by mouth daily.   traMADol  (ULTRAM ) 50 MG tablet, Take 1 tablet (50 mg total) by mouth every 12 (twelve) hours as needed for up to 5 days.  Current Outpatient Medications (Hematological):    Ferrous Sulfate (IRON) 325 (65 Fe) MG TABS, Take by mouth. Take one by mouth three times a week.  Current Outpatient Medications (Other):    cyclobenzaprine  (FLEXERIL ) 10 MG tablet, Take 1 tablet (10mg ) by mouth daily.   cyclobenzaprine  (FLEXERIL ) 10 MG tablet, Take 1 tablet (10 mg total) by mouth at bedtime as needed for muscle spasms.   gabapentin  (NEURONTIN ) 400 MG capsule, TAKE 1 CAPSULE BY MOUTH DAILY AT BEDTIME   gabapentin  (NEURONTIN ) 400 MG capsule, Take 1 capsule (400 mg total) by mouth at bedtime.   Multiple Vitamin (MULTIVITAMIN) tablet, Take 1 tablet by mouth daily.   TURMERIC PO, Take by mouth.   Vitamin D , Cholecalciferol, 1000 units TABS, Take 2,000 Units by mouth daily.   tiZANidine  (ZANAFLEX ) 2 MG tablet, Take 1-2 tablets (2-4 mg total) by mouth every 8 (eight) hours as needed for muscle spasms. (Patient not taking: Reported on 06/12/2023)   Reviewed prior external information including notes and imaging from  primary care provider As well as notes that were available from care everywhere and other healthcare systems.  Past medical history, social, surgical and family history all reviewed in electronic medical record.  No pertanent information unless stated regarding to the chief complaint.   Review of  Systems:  No headache, visual changes, nausea, vomiting, diarrhea, constipation, dizziness, abdominal pain, skin rash, fevers, chills, night sweats, weight loss, swollen lymph nodes, body aches, joint swelling, chest pain, shortness of breath, mood changes. POSITIVE muscle aches  Objective  Blood pressure 120/76, pulse 64, height 5' 10 (1.778 m), weight 200 lb (90.7 kg), SpO2 100%.   General: No apparent distress alert and oriented x3 mood and affect normal, dressed appropriately.  HEENT: Pupils equal, extraocular movements intact  Respiratory: Patient's speak in full sentences and does not appear short of breath  Cardiovascular: No lower extremity edema, non tender, no erythema  Shoulder exam shows good range of motion at the moment.  Nontender on exam.  Low back exam shows still some  loss of lordosis.  Able to get up from a seated position without any significant difficulty.    Impression and Recommendations:    The above documentation has been reviewed and is accurate and complete Oluwaseun Bruyere M Liley Rake, DO

## 2023-09-26 ENCOUNTER — Ambulatory Visit (INDEPENDENT_AMBULATORY_CARE_PROVIDER_SITE_OTHER): Admitting: Family Medicine

## 2023-09-26 VITALS — BP 120/76 | HR 64 | Ht 70.0 in | Wt 200.0 lb

## 2023-09-26 DIAGNOSIS — M5417 Radiculopathy, lumbosacral region: Secondary | ICD-10-CM

## 2023-09-26 MED ORDER — CYCLOBENZAPRINE HCL 10 MG PO TABS: 10.0000 mg | ORAL_TABLET | Freq: Every evening | ORAL | 3 refills | Status: AC | PRN

## 2023-09-26 MED ORDER — GABAPENTIN 400 MG PO CAPS: 400.0000 mg | ORAL_CAPSULE | Freq: Every day | ORAL | 3 refills | Status: AC

## 2023-09-26 MED ORDER — TRAMADOL HCL 50 MG PO TABS
50.0000 mg | ORAL_TABLET | Freq: Two times a day (BID) | ORAL | 0 refills | Status: AC | PRN
Start: 2023-09-26 — End: 2023-10-01

## 2023-09-26 NOTE — Patient Instructions (Signed)
 Refill gabapentin  and flexeril  Stay active Call if need back injection See me when you need me

## 2023-09-26 NOTE — Assessment & Plan Note (Signed)
 Known lumbar radiculopathy that has responded extremely well to the gabapentin  at 400 mg without any significant difficulty or side effects.  Refilled for 1 year supply.  Flexeril  for breakthrough pain and tramadol  for the severe pain that is disabling.  Patient is use less than 10 in 1 year.  Given another refill of 10 of these.  Warned of side effects and synergistic effect with Tylenol .  Follow-up with me again in 1 year if doing well, can always do an injection otherwise.

## 2023-10-09 ENCOUNTER — Ambulatory Visit: Payer: Self-pay

## 2023-10-09 NOTE — Telephone Encounter (Signed)
 FYI Only or Action Required?: FYI only for provider.  Patient was last seen in primary care on 06/12/2023 by Rollene Almarie LABOR, MD.  Called Nurse Triage reporting eye swelling.  Symptoms began today.  Interventions attempted: Ice/heat application.  Symptoms are: gradually worsening.  Triage Disposition: See Physician Within 24 Hours  Patient/caregiver understands and will follow disposition?: Yes   Copied from CRM #[id number]. Topic: Clinical - Red Word Triage >> Oct 09, 2023  3:43 PM Viola F wrote: Red Word that prompted transfer to Nurse Triage: Patient left eye swollen, wants to know what to do Reason for Disposition  MODERATE-SEVERE eyelid swelling on one side  (Exception: Due to a mosquito bite.)  Answer Assessment - Initial Assessment Questions 1. ONSET: When did the swelling start? (e.g., minutes, hours, days)     today 2. LOCATION: What part of the eyelids is swollen?     Started at corner now spread to other corner. Entire lower lid 3. SEVERITY: How swollen is it?     Moderate 4. ITCHING: Is there any itching? If Yes, ask: How much?   (Scale 1-10; mild, moderate or severe)     irritated 5. PAIN: Is the swelling painful to touch? If Yes, ask: How painful is it?   (Scale 1-10; mild, moderate or severe)     irritation 6. FEVER: Do you have a fever? If Yes, ask: What is it, how was it measured, and when did it start?      Denies  7. CAUSE: What do you think is causing the swelling?     Unsure 8. RECURRENT SYMPTOM: Have you had eyelid swelling before? If Yes, ask: When was the last time? What happened that time?     Yes, warm compress helped 9. OTHER SYMPTOMS: Do you have any other symptoms? (e.g., blurred vision, eye discharge, rash, runny nose)     Clear drainage from  Protocols used: Eye - Swelling-A-AH

## 2023-10-10 ENCOUNTER — Ambulatory Visit (INDEPENDENT_AMBULATORY_CARE_PROVIDER_SITE_OTHER): Admitting: Family Medicine

## 2023-10-10 ENCOUNTER — Encounter: Payer: Self-pay | Admitting: Family Medicine

## 2023-10-10 VITALS — BP 134/76 | HR 58 | Temp 98.3°F | Ht 70.0 in | Wt 199.6 lb

## 2023-10-10 DIAGNOSIS — H01004 Unspecified blepharitis left upper eyelid: Secondary | ICD-10-CM

## 2023-10-10 DIAGNOSIS — H02846 Edema of left eye, unspecified eyelid: Secondary | ICD-10-CM

## 2023-10-10 DIAGNOSIS — H04203 Unspecified epiphora, bilateral lacrimal glands: Secondary | ICD-10-CM | POA: Insufficient documentation

## 2023-10-10 MED ORDER — DOXYCYCLINE HYCLATE 100 MG PO CAPS: 100.0000 mg | ORAL_CAPSULE | Freq: Two times a day (BID) | ORAL | 0 refills | Status: AC

## 2023-10-10 MED ORDER — OLOPATADINE HCL 0.2 % OP SOLN
1.0000 [drp] | Freq: Every day | OPHTHALMIC | 0 refills | Status: AC
Start: 2023-10-10 — End: 2023-10-15

## 2023-10-10 NOTE — Patient Instructions (Signed)
 I have sent in pataday  eye drops for you to use one drop daily for 5 days.   I have sent in doxycycline  for you to take twice a day for a week. Eat with this medication, it can upset your stomach if you do not.  Follow-up with me for new or worsening symptoms.

## 2023-10-10 NOTE — Progress Notes (Signed)
 Acute Office Visit  Subjective:     Patient ID: Kirsten Campbell, female    DOB: 16-Jan-1961, 63 y.o.   MRN: [id number]  Chief Complaint  Patient presents with   Acute Visit    L eye swelling, noticed 09/10. Has been doing warm compresses, was initially itcgy. Eye watering, denies pus like drainage.    HPI  Discussed the use of AI scribe software for clinical note transcription with the patient, who gave verbal consent to proceed.  History of Present Illness Kirsten Campbell is a 63 year old female who presents with left upper eyelid swelling and tenderness.  Left upper eyelid swelling and tenderness - Onset yesterday - Swelling is solid and movable on the upper lid - Initially caused the eye to be shut, but swelling has since shifted position - Warm compresses applied to the area - Tenderness present upon touch - Some drainage from the eye - No new makeup use - No changes in vision other than swelling obstructing view  Ophthalmologic history - Missed recent eye exam - History of regular annual eye exams     ROS Per HPI      Objective:    BP 134/76 (BP Location: Left Arm, Patient Position: Sitting)   Pulse (!) 58   Temp 98.3 F (36.8 C) (Temporal)   Ht 5' 10 (1.778 m)   Wt 199 lb 9.6 oz (90.5 kg)   SpO2 96%   BMI 28.64 kg/m    Physical Exam Vitals and nursing note reviewed.  Constitutional:      General: She is not in acute distress.    Appearance: Normal appearance.  HENT:     Head: Normocephalic and atraumatic.     Right Ear: External ear normal.     Left Ear: External ear normal.     Nose: Nose normal.     Mouth/Throat:     Mouth: Mucous membranes are moist.     Pharynx: Oropharynx is clear.  Eyes:     General:        Right eye: Discharge present. No foreign body.        Left eye: Discharge present.No foreign body.     Extraocular Movements: Extraocular movements intact.     Conjunctiva/sclera:     Right eye: Right conjunctiva is not  injected. No chemosis, exudate or hemorrhage.    Left eye: Left conjunctiva is injected. No chemosis, exudate or hemorrhage.    Pupils: Pupils are equal, round, and reactive to light.      Comments: Area of mild erythema, swelling, tenderness. Thicker clear fluid draining from the area. Clear fluid draining from R eye, no swelling, erythema or tenderness to the R eye  Cardiovascular:     Rate and Rhythm: Normal rate.  Pulmonary:     Effort: Pulmonary effort is normal.  Musculoskeletal:        General: Normal range of motion.     Cervical back: Normal range of motion.     Right lower leg: No edema.     Left lower leg: No edema.  Lymphadenopathy:     Cervical: No cervical adenopathy.  Neurological:     General: No focal deficit present.     Mental Status: She is alert and oriented to person, place, and time.  Psychiatric:        Mood and Affect: Mood normal.        Thought Content: Thought content normal.     No results found for  any visits on 10/10/23.      Assessment & Plan:   Assessment and Plan Assessment & Plan Left upper eyelid infection with edema (Blepharitis) Acute bacterial infection of left upper eyelid with edema - Prescribed doxycycline , advised to take with food and caution about sun sensitivity. - Advised continuation of warm compresses for relief. - Prescribed eye drops for both eyes to alleviate drainage and discomfort.     No orders of the defined types were placed in this encounter.    Meds ordered this encounter  Medications   doxycycline  (VIBRAMYCIN ) 100 MG capsule    Sig: Take 1 capsule (100 mg total) by mouth 2 (two) times daily for 7 days.    Dispense:  14 capsule    Refill:  0   Olopatadine  HCl 0.2 % SOLN    Sig: Apply 1 drop to eye daily for 5 days.    Dispense:  2.5 mL    Refill:  0    Return if symptoms worsen or fail to improve.  Corean LITTIE Ku, FNP

## 2023-10-30 ENCOUNTER — Ambulatory Visit
Admission: RE | Admit: 2023-10-30 | Discharge: 2023-10-30 | Disposition: A | Discharge status: Home / Self Care | Source: Ambulatory Visit | Attending: Obstetrics and Gynecology | Admitting: Obstetrics and Gynecology

## 2023-10-30 DIAGNOSIS — Z1231 Encounter for screening mammogram for malignant neoplasm of breast: Secondary | ICD-10-CM | POA: Diagnosis not present

## 2023-10-31 ENCOUNTER — Other Ambulatory Visit: Payer: Self-pay | Admitting: Family Medicine

## 2023-12-13 ENCOUNTER — Ambulatory Visit (INDEPENDENT_AMBULATORY_CARE_PROVIDER_SITE_OTHER): Admitting: Internal Medicine

## 2023-12-13 ENCOUNTER — Encounter: Payer: Self-pay | Admitting: Internal Medicine

## 2023-12-13 VITALS — BP 120/60 | HR 71 | Temp 98.3°F | Ht 70.0 in | Wt 198.0 lb

## 2023-12-13 DIAGNOSIS — H04203 Unspecified epiphora, bilateral lacrimal glands: Secondary | ICD-10-CM | POA: Diagnosis not present

## 2023-12-13 DIAGNOSIS — E119 Type 2 diabetes mellitus without complications: Secondary | ICD-10-CM | POA: Diagnosis not present

## 2023-12-13 LAB — POCT GLYCOSYLATED HEMOGLOBIN (HGB A1C): Hemoglobin A1C: 6.2 % — AB (ref 4.0–5.6)

## 2023-12-13 NOTE — Patient Instructions (Signed)
 Your HgA1c today is 6.2 which is still in the pre-diabetes.

## 2023-12-13 NOTE — Assessment & Plan Note (Signed)
 Chronic bilateral watery eyes with clear drainage are likely due to dry eye or allergies, with no signs of bacterial infection or tumor. The differential includes dry eye and allergies. Use allergy eye drops as needed. Consult with an eye doctor to determine if symptoms are due to dry eye or allergies. Consider using Flonase  for sinus-related tingling.

## 2023-12-13 NOTE — Assessment & Plan Note (Signed)
 Type 2 diabetes mellitus is well controlled with an A1c of 6.2. Regular physical activity and dietary modifications have contributed to weight loss and improved glycemic control. Continue current exercise regimen and dietary modifications.

## 2023-12-13 NOTE — Progress Notes (Signed)
   Subjective:   Patient ID: Kirsten Campbell, female    DOB: [birth year], 63 y.o.   MRN: [id number]  Discussed the use of AI scribe software for clinical note transcription with the patient, who gave verbal consent to proceed.  History of Present Illness Kirsten Campbell is a 63 year old female who presents with eye drainage and tingling in the head.  She has been experiencing clear, watery discharge from both eyes, particularly when bending over. This began almost a year ago following an eye exam, initially affecting the right eye and later involving both eyes. She was treated with antibiotics and eye drops for an infection in the right eye, but symptoms have persisted intermittently. Her eyes sometimes become crusty in the morning.  She experiences a tingling sensation in her head that starts at the neck and moves upward, which she associates with stress or sinus issues. No new chest pains, tightness, pressure, breathing trouble, or stomach issues.  Her recent A1c was 6.2, with a previous high of 6.7. She misses sweets but is managing her diet to maintain her blood sugar levels. She maintains an active lifestyle, exercising daily, including walking three days a week and participating in chair yoga and Tai Chi classes at an adult center. She has been losing weight and is focused on staying active.  Review of Systems  Objective:  Physical Exam  Vitals:   12/13/23 0853  BP: 120/60  Pulse: 71  Temp: 98.3 F (36.8 C)  TempSrc: Oral  SpO2: 98%  Weight: 198 lb (89.8 kg)  Height: 5' 10 (1.778 m)    Assessment and Plan Assessment & Plan Chronic bilateral watery eyes (epiphora), likely due to dry eye or allergies   Chronic bilateral watery eyes with clear drainage are likely due to dry eye or allergies, with no signs of bacterial infection or tumor. The differential includes dry eye and allergies. Use allergy eye drops as needed. Consult with an eye doctor to determine if symptoms are due to  dry eye or allergies. Consider using Flonase  for sinus-related tingling.  Type 2 diabetes mellitus, well controlled   Type 2 diabetes mellitus is well controlled with an A1c of 6.2. Regular physical activity and dietary modifications have contributed to weight loss and improved glycemic control. Continue current exercise regimen and dietary modifications.

## 2023-12-20 DIAGNOSIS — H2513 Age-related nuclear cataract, bilateral: Secondary | ICD-10-CM | POA: Diagnosis not present

## 2023-12-20 DIAGNOSIS — H524 Presbyopia: Secondary | ICD-10-CM | POA: Diagnosis not present

## 2023-12-20 DIAGNOSIS — H01111 Allergic dermatitis of right upper eyelid: Secondary | ICD-10-CM | POA: Diagnosis not present

## 2023-12-20 DIAGNOSIS — H43393 Other vitreous opacities, bilateral: Secondary | ICD-10-CM | POA: Diagnosis not present

## 2024-01-01 ENCOUNTER — Other Ambulatory Visit (HOSPITAL_COMMUNITY): Payer: Self-pay

## 2024-03-12 ENCOUNTER — Encounter: Payer: 59 | Admitting: Internal Medicine

## 2024-03-13 ENCOUNTER — Encounter: Admitting: Internal Medicine
